# Patient Record
Sex: Male | Born: 1975 | Race: White | Hispanic: No | Marital: Married | Smoking: Never smoker
Health system: Southern US, Community
[De-identification: ages and names within clinical notes are randomized; demographics above are authoritative.]

## PROBLEM LIST (undated history)

## (undated) DIAGNOSIS — F419 Anxiety disorder, unspecified: Secondary | ICD-10-CM

## (undated) DIAGNOSIS — Q600 Renal agenesis, unilateral: Secondary | ICD-10-CM

## (undated) HISTORY — DX: Anxiety disorder, unspecified: F41.9

## (undated) HISTORY — DX: Renal agenesis, unilateral: Q60.0

## (undated) HISTORY — PX: NO PAST SURGERIES: SHX2092

---

## 2006-11-19 ENCOUNTER — Emergency Department: Payer: Self-pay | Admitting: Emergency Medicine

## 2007-04-03 ENCOUNTER — Ambulatory Visit: Payer: Self-pay | Admitting: Urology

## 2007-04-17 ENCOUNTER — Ambulatory Visit: Payer: Self-pay | Admitting: Urology

## 2007-05-01 ENCOUNTER — Ambulatory Visit: Payer: Self-pay | Admitting: Urology

## 2007-08-06 ENCOUNTER — Ambulatory Visit: Payer: Self-pay | Admitting: Urology

## 2008-02-04 ENCOUNTER — Ambulatory Visit: Payer: Self-pay | Admitting: Urology

## 2008-08-13 ENCOUNTER — Ambulatory Visit: Payer: Self-pay | Admitting: Urology

## 2009-05-18 ENCOUNTER — Ambulatory Visit: Payer: Self-pay | Admitting: Urology

## 2010-10-04 ENCOUNTER — Ambulatory Visit: Payer: Self-pay | Admitting: Urology

## 2011-04-16 ENCOUNTER — Ambulatory Visit: Payer: Self-pay | Admitting: Urology

## 2011-07-04 ENCOUNTER — Ambulatory Visit: Payer: Self-pay | Admitting: Urology

## 2011-07-19 ENCOUNTER — Ambulatory Visit: Payer: Self-pay | Admitting: Urology

## 2011-08-02 ENCOUNTER — Ambulatory Visit: Payer: Self-pay | Admitting: Urology

## 2011-12-21 ENCOUNTER — Ambulatory Visit: Payer: Self-pay | Admitting: Urology

## 2012-09-10 DIAGNOSIS — Q602 Renal agenesis, unspecified: Secondary | ICD-10-CM | POA: Insufficient documentation

## 2012-09-10 DIAGNOSIS — N200 Calculus of kidney: Secondary | ICD-10-CM | POA: Insufficient documentation

## 2012-09-24 ENCOUNTER — Ambulatory Visit: Payer: Self-pay | Admitting: Urology

## 2013-07-21 ENCOUNTER — Emergency Department: Payer: Self-pay | Admitting: Emergency Medicine

## 2013-07-21 LAB — RAPID INFLUENZA A&B ANTIGENS

## 2013-12-25 ENCOUNTER — Ambulatory Visit: Payer: Self-pay | Admitting: Urology

## 2015-04-18 ENCOUNTER — Encounter: Payer: Self-pay | Admitting: Family Medicine

## 2015-04-18 ENCOUNTER — Ambulatory Visit (INDEPENDENT_AMBULATORY_CARE_PROVIDER_SITE_OTHER): Payer: 59 | Admitting: Family Medicine

## 2015-04-18 VITALS — BP 118/82 | HR 86 | Temp 98.7°F | Resp 16 | Ht 72.0 in | Wt 199.6 lb

## 2015-04-18 DIAGNOSIS — N200 Calculus of kidney: Secondary | ICD-10-CM

## 2015-04-18 DIAGNOSIS — F419 Anxiety disorder, unspecified: Secondary | ICD-10-CM | POA: Diagnosis not present

## 2015-04-18 MED ORDER — CLONAZEPAM 0.5 MG PO TABS: ORAL_TABLET | ORAL | Status: DC

## 2015-04-18 NOTE — Patient Instructions (Signed)
Phone f/u in doing well

## 2015-04-18 NOTE — Progress Notes (Signed)
Subjective:     Patient ID: Eric Blankenship, male   DOB: 09/20/1975, 39 y.o.   MRN: [id number]  HPI  Chief Complaint  Patient presents with  . 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 Recently he stated he climbed under hom but was snuck trying to come out because crawl space was so little. Patient denies any history pf phobias, but states that this has been affecting his job.   Reports a panic attack when he got stuck in a crawl space 1.5 years ago. Since then has had intermittent "tingling" when confronted with particular crawl spaces at work. He is concerned that it is affecting his ability to do his job. Denies anxiety in other settings. Years ago was on sertraline and Xanax due to panic sx around the birth of his child. Reports he felt like a "zombie' while on sertraline.   Review of Systems     Objective:   Physical Exam  Constitutional: He appears well-developed and well-nourished. No distress.  Psychiatric: He has a normal mood and affect. His behavior is normal.       Assessment:    1. Acute anxiety: intermittent  - clonazePAM (KLONOPIN) 0.5 MG tablet; 1/2 to one twice daily as needed for anxiety  Dispense: 20 tablet; Refill: 0    Plan:    Discussed sedation with this medication with f/u in two weeks.

## 2015-04-28 ENCOUNTER — Other Ambulatory Visit: Payer: Self-pay | Admitting: Family Medicine

## 2015-04-28 ENCOUNTER — Telehealth: Payer: Self-pay | Admitting: Family Medicine

## 2015-04-28 DIAGNOSIS — F419 Anxiety disorder, unspecified: Secondary | ICD-10-CM

## 2015-04-28 MED ORDER — CLONAZEPAM 0.5 MG PO TABS: ORAL_TABLET | ORAL | Status: DC

## 2015-04-28 NOTE — Telephone Encounter (Signed)
Pt called saying the clonazepam 0.5 mg is working and he is taking one pill twice a day.  He uses [facility]. Pharmacy.    His call back is  [phone]  Thanks teri

## 2015-04-28 NOTE — Telephone Encounter (Signed)
Update on medication. KW

## 2015-04-28 NOTE — Telephone Encounter (Signed)
Patient advised.

## 2015-04-28 NOTE — Telephone Encounter (Signed)
Called into [facility] pharmacy.  

## 2015-05-06 ENCOUNTER — Ambulatory Visit: Payer: 59 | Admitting: Family Medicine

## 2015-05-22 IMAGING — CR DG ABDOMEN 1V
1 series · 1 of 1 positions shown · non-contrast
Comparison: 09/24/2012

CLINICAL DATA: Yearly followup.  History of kidney stones.

EXAM:
ABDOMEN - 1 VIEW

[t abdomen supine]
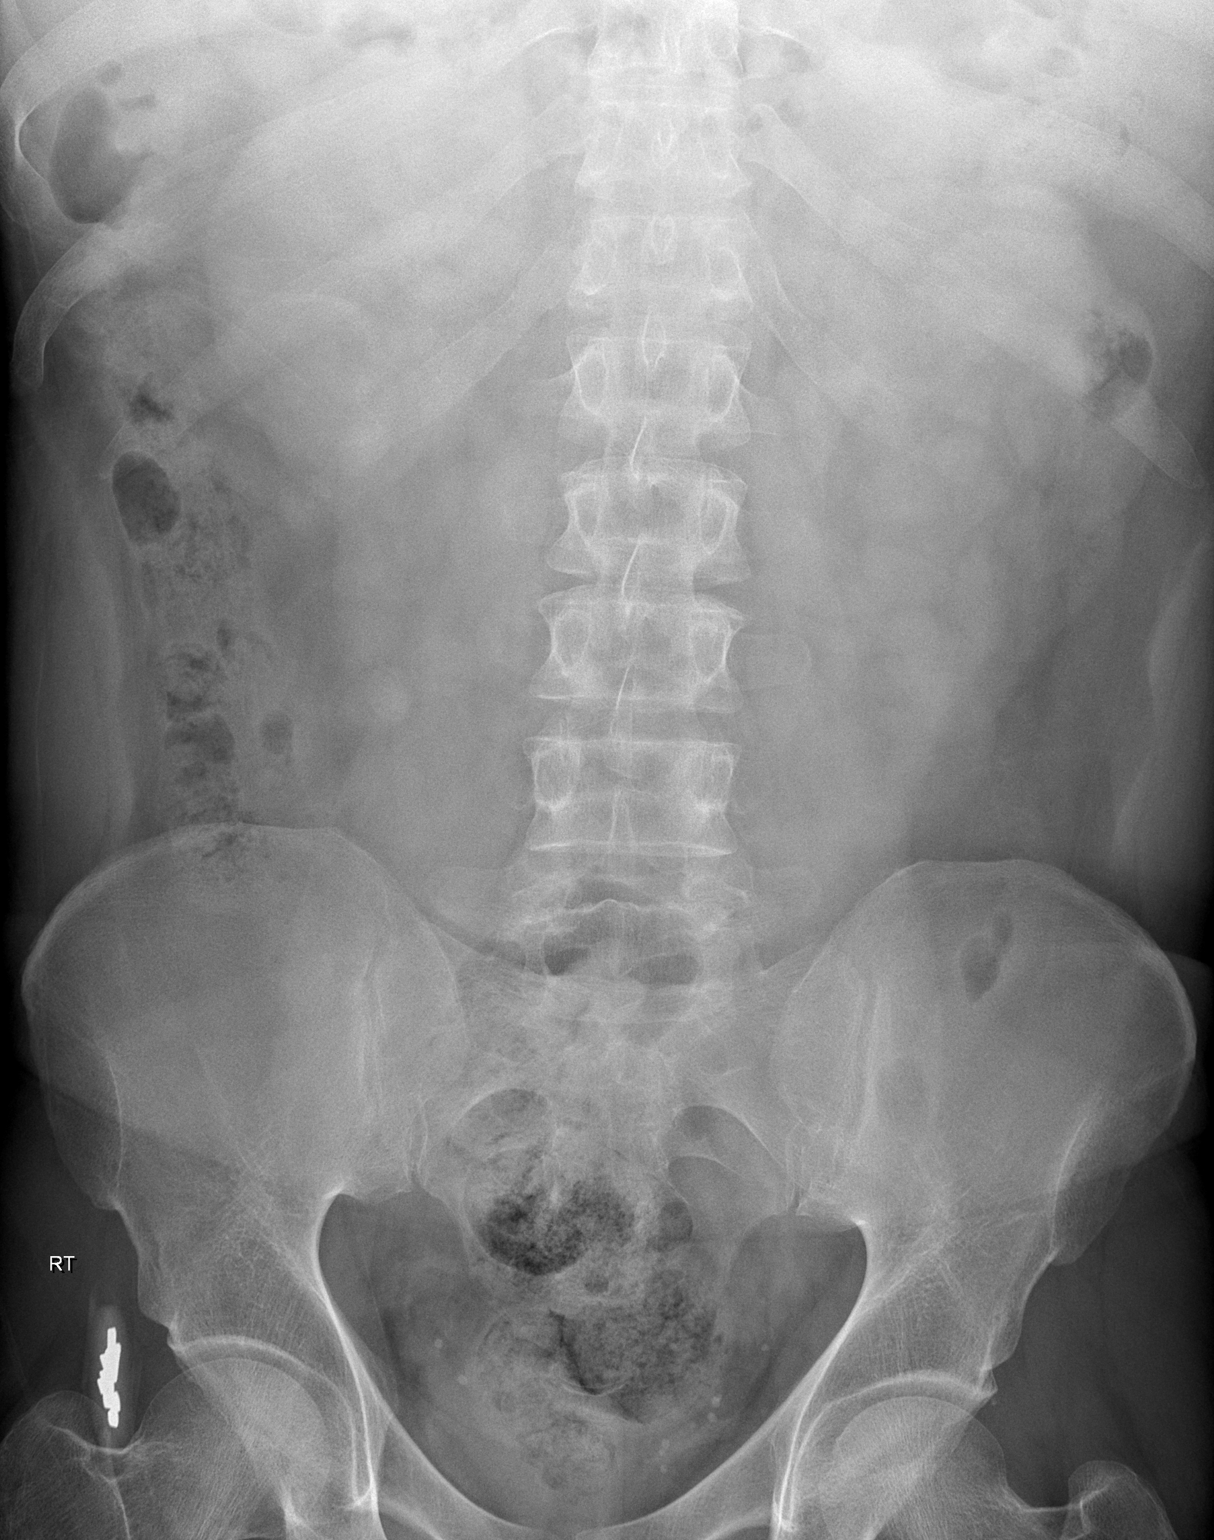

[1 of 1 positions shown; findings below may reference images not displayed]

FINDINGS: Tiny stone suggested in the lower pole of the right kidney are again
noted. There is no evidence of a new renal stone. No evidence of a
ureteral stone. There multiple stable phleboliths in the pelvis.

Bowel gas pattern is normal. Soft tissues are otherwise
unremarkable. No significant bony abnormality.
IMPRESSION: 1. No change from the prior study.
2. Subtle tiny stones suggested in the lower pole of the right
kidney.

## 2015-08-16 ENCOUNTER — Encounter: Payer: Self-pay | Admitting: Family Medicine

## 2015-08-16 ENCOUNTER — Ambulatory Visit (INDEPENDENT_AMBULATORY_CARE_PROVIDER_SITE_OTHER): Payer: 59 | Admitting: Family Medicine

## 2015-08-16 VITALS — BP 108/82 | HR 60 | Temp 97.5°F | Resp 16 | Wt 208.0 lb

## 2015-08-16 DIAGNOSIS — J039 Acute tonsillitis, unspecified: Secondary | ICD-10-CM

## 2015-08-16 DIAGNOSIS — Z87442 Personal history of urinary calculi: Secondary | ICD-10-CM | POA: Insufficient documentation

## 2015-08-16 LAB — POCT RAPID STREP A (OFFICE): RAPID STREP A SCREEN: POSITIVE — AB

## 2015-08-16 MED ORDER — AZITHROMYCIN 250 MG PO TABS: ORAL_TABLET | ORAL | Status: DC

## 2015-08-16 NOTE — Progress Notes (Signed)
Subjective:     Patient ID: Eric Blankenship, male   DOB: [birth year], 40 y.o.   MRN: [id number]  HPI  Chief Complaint  Patient presents with  . Sore Throat    Patient comes in office today with concerns of sore throat and ear pain since Friday. Patient reports that he only has discomfort on his left side and has noticed tenderness around his lymph node. Patient denies cold like symptoms but states that he does have sinus drainage. Patient has taken otc Nightquil and Tylenol for relief.  States his wife has mild cold sx and his son has strep throat 3 weeks ago. Reports hx if tonsil stones he has removed himself.   Review of Systems  Constitutional: Negative for fever and chills (no body aches).       Objective:   Physical Exam  Constitutional: He appears well-developed and well-nourished. No distress.  Ears: T.M's intact without inflammation Sinuses: non-tender Throat: mildly enlarged and erythematous tonsils with ?of right exudate or debris. Neck: no cervical adenopathy Lungs: clear     Assessment:    1. Tonsillitis - POCT rapid strep A - azithromycin (ZITHROMAX Z-PAK) 250 MG tablet; Two pills the first day then one pill daily for 4 days.  Dispense: 6 each; Refill: 0    Plan:    Discussed use of saline gargles and Mucinex D for congestion.

## 2015-08-16 NOTE — Patient Instructions (Signed)
Discussed use of warm salt water gargles and Mucinex D for congestion.

## 2015-11-01 ENCOUNTER — Ambulatory Visit (INDEPENDENT_AMBULATORY_CARE_PROVIDER_SITE_OTHER): Payer: 59 | Admitting: Family Medicine

## 2015-11-01 ENCOUNTER — Encounter: Payer: Self-pay | Admitting: Family Medicine

## 2015-11-01 VITALS — BP 110/72 | HR 66 | Temp 97.6°F | Resp 16 | Ht 72.0 in | Wt 203.2 lb

## 2015-11-01 DIAGNOSIS — E781 Pure hyperglyceridemia: Secondary | ICD-10-CM

## 2015-11-01 DIAGNOSIS — Z131 Encounter for screening for diabetes mellitus: Secondary | ICD-10-CM

## 2015-11-01 DIAGNOSIS — F411 Generalized anxiety disorder: Secondary | ICD-10-CM | POA: Diagnosis not present

## 2015-11-01 NOTE — Patient Instructions (Addendum)
Increase the clonazepam to two pills twice daily as needed. Give me a telephone follow upon how you did on the increased dose. We will call you with the lab results.

## 2015-11-01 NOTE — Progress Notes (Signed)
Subjective:     Patient ID: Eric Blankenship, male   DOB: 12-Nov-1975, 40 y.o.   MRN: [id number]  HPI  Chief Complaint  Patient presents with  . Anxiety    Patient comes in office today for follow up, last visit was on 04/18/15 patient was started on Klonopin 0.5mg  . Patient reports good compliance and tolerance on medication but states that his symptom control has been fair-poorly, patient states " I have been having more episodes latley and want to discuss increasing medication."  States he feels the medication wearing off despite taking at 8 AM and 12. 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 Also agrees to update baseline labs. Has a hx of elevated triglycerides.   Review of Systems  Psychiatric/Behavioral:       Has been on sertraline in the past but states it made him feel like a " zombie."       Objective:   Physical Exam  Constitutional: He appears well-developed and well-nourished. No distress.  Psychiatric: He has a normal mood and affect.       Assessment:    1. Generalized anxiety disorder  2. Hypertriglyceridemia - Lipid panel  3. Screening for diabetes mellitus - Comprehensive metabolic panel    Plan:    Will have him increase his clonazepam to two pills twice daily with phone follow-up

## 2015-11-02 ENCOUNTER — Telehealth: Payer: Self-pay

## 2015-11-02 ENCOUNTER — Telehealth: Payer: Self-pay | Admitting: Family Medicine

## 2015-11-02 LAB — COMPREHENSIVE METABOLIC PANEL
ALBUMIN: 4.6 g/dL (ref 3.5–5.5)
ALK PHOS: 77 IU/L (ref 39–117)
ALT: 29 IU/L (ref 0–44)
AST: 21 IU/L (ref 0–40)
Albumin/Globulin Ratio: 1.6 (ref 1.2–2.2)
BUN / CREAT RATIO: 11 (ref 9–20)
BUN: 14 mg/dL (ref 6–20)
Bilirubin Total: 0.3 mg/dL (ref 0.0–1.2)
CALCIUM: 10.1 mg/dL (ref 8.7–10.2)
CO2: 24 mmol/L (ref 18–29)
CREATININE: 1.23 mg/dL (ref 0.76–1.27)
Chloride: 99 mmol/L (ref 96–106)
GFR calc Af Amer: 86 mL/min/{1.73_m2} (ref >59)
GFR, EST NON AFRICAN AMERICAN: 74 mL/min/{1.73_m2} (ref >59)
GLOBULIN, TOTAL: 2.8 g/dL (ref 1.5–4.5)
GLUCOSE: 96 mg/dL (ref 65–99)
Potassium: 5.2 mmol/L (ref 3.5–5.2)
SODIUM: 142 mmol/L (ref 134–144)
TOTAL PROTEIN: 7.4 g/dL (ref 6.0–8.5)

## 2015-11-02 LAB — LIPID PANEL
CHOLESTEROL TOTAL: 307 mg/dL — AB (ref 100–199)
Chol/HDL Ratio: 14.6 ratio units — ABNORMAL HIGH (ref 0.0–5.0)
HDL: 21 mg/dL — ABNORMAL LOW (ref >39)
Triglycerides: 1816 mg/dL (ref 0–149)

## 2015-11-02 NOTE — Telephone Encounter (Signed)
Pt contacted office for refill request on the following medications:  clonazePAM (KLONOPIN) 0.5 MG tablet.  [facility].  ZO#[phone]/WJB#[id number]/MW  Pt wife, Isabelle CourseLydia states pt was in yesterday to see Nadine CountsBob and the requested a refill.  The pharmacy has not rec'd the refill request.

## 2015-11-02 NOTE — Telephone Encounter (Signed)
-----   Message from Anola Gurneyobert Chauvin, [place] sent at 11/02/2015  7:48 AM EDT ----- Please add Direct LDL to labs. Triglycerides are very elevated as expected. As Total cholesterol is very high suspect LDL will be too. We will get that lab value and call you again when available. Other labs including sugar look ok.

## 2015-11-02 NOTE — Telephone Encounter (Signed)
This medication was last prescribed 04/28/2015, please review. KW

## 2015-11-02 NOTE — Telephone Encounter (Signed)
Contacted [facility] to order additional lab, customer representative states that she will fax over confirmation form if they have enough left of specimen to run additional test. [facility] will contact us back if there is not a sufficient amount to run test. Renette ButtersKW

## 2015-11-02 NOTE — Telephone Encounter (Signed)
Discussed patient trying two pills twice daily with remaining supply at office visit. Phone f/u this week regarding this increased dose.

## 2015-11-02 NOTE — Telephone Encounter (Signed)
Patient has been advised of lab report as of now and additional lab ordered. Will call patient back when we receive remainder of labs, patient understood. KW

## 2015-11-02 NOTE — Telephone Encounter (Signed)
Pt called regarding his labs.  Please advise.  [phone].  Eric Blankenship

## 2015-11-03 ENCOUNTER — Other Ambulatory Visit: Payer: Self-pay | Admitting: Family Medicine

## 2015-11-03 DIAGNOSIS — E781 Pure hyperglyceridemia: Secondary | ICD-10-CM

## 2015-11-03 LAB — LDL CHOLESTEROL, DIRECT: LDL Direct: 25 mg/dL (ref 0–99)

## 2015-11-03 LAB — SPECIMEN STATUS REPORT

## 2015-11-03 MED ORDER — FENOFIBRATE 145 MG PO TABS: 145.0000 mg | ORAL_TABLET | Freq: Every day | ORAL | Status: DC

## 2015-11-03 NOTE — Telephone Encounter (Signed)
Patient had more questions regarding his triglycerides medication. He also wanted to know more about the test that was added to his specimen. Will you please call patient to explain? Thanks!

## 2015-11-03 NOTE — Telephone Encounter (Signed)
-----   Message from Anola Gurneyobert Chauvin, [place] sent at 11/03/2015  8:04 AM EDT ----- Your LDL cholesterol is ok so I will focus on lowering your triglycerides. I have sent in a once daily medication for you. Follow up in the office in 4 -6 weeks.

## 2015-11-03 NOTE — Telephone Encounter (Signed)
Discussed starting fenofibrate.

## 2015-11-04 ENCOUNTER — Other Ambulatory Visit: Payer: Self-pay | Admitting: Family Medicine

## 2015-11-04 ENCOUNTER — Telehealth: Payer: Self-pay | Admitting: Family Medicine

## 2015-11-04 DIAGNOSIS — F419 Anxiety disorder, unspecified: Secondary | ICD-10-CM

## 2015-11-04 MED ORDER — CLONAZEPAM 0.5 MG PO TABS
ORAL_TABLET | ORAL | Status: DC
Start: 2015-11-04 — End: 2016-05-22

## 2015-11-04 NOTE — Telephone Encounter (Signed)
Please review. KW 

## 2015-11-04 NOTE — Telephone Encounter (Signed)
Discussed new dose of 1 1/2 pills daily- called in.

## 2015-11-04 NOTE — Telephone Encounter (Signed)
The medication does not CAUSE kidney dysfunction; It is to be used with caution in those with kidney problems( which you don't have) as the medication is partially excreted by the kidney. If you still don't want to use it go back on Omega- 3 fish oil supplements 4 grams daily.

## 2015-11-04 NOTE — Telephone Encounter (Signed)
Pt's wife called to get an update about pt's refill for clonazePAM (KLONOPIN) 0.5 MG tablet to [facility]. There is a note in this message below to discuss the dosage with pt. Pt's wife request a call back about the refill and she is on pt's DPR. Please advise. Thanks TNP

## 2015-11-04 NOTE — Telephone Encounter (Signed)
Pt's wife picked up his medication for his triglycerides level being high and he read that  the fenofibrate (TRICOR) 145 MG tablet warnings were not to take with kidney disease.  Pt states he only has one kidney and is afraid to take this medication.  Please advise.  His call back is [phone]  Thanks, Barth Kirksteri

## 2015-11-05 NOTE — Telephone Encounter (Signed)
Spoke with patient on the phone and advised as below, patient declined starting Fenofibrate due to side effects. Patient states that his wife works in the health care field and is aware of this medication. Patient states that he does eat starches such as potatoes, bread and pasta he wondered if the sugars from these foods could cause elevation in his triglyceride level. Patient reports that he is going to continue his dietary changes and start Omega-3 fish oil supplement. Patient states when he returns for follow up labs if there is no improvement then he will reconsider taking medication. KW

## 2016-05-22 ENCOUNTER — Other Ambulatory Visit: Payer: Self-pay | Admitting: Family Medicine

## 2016-05-22 DIAGNOSIS — F419 Anxiety disorder, unspecified: Secondary | ICD-10-CM

## 2016-05-22 MED ORDER — CLONAZEPAM 0.5 MG PO TABS: ORAL_TABLET | ORAL | 5 refills | Status: DC

## 2016-05-22 NOTE — Progress Notes (Signed)
Prescription has been called into pharmacy. KW 

## 2016-06-20 DIAGNOSIS — Z6825 Body mass index (BMI) 25.0-25.9, adult: Secondary | ICD-10-CM | POA: Diagnosis not present

## 2016-06-20 DIAGNOSIS — Q602 Renal agenesis, unspecified: Secondary | ICD-10-CM | POA: Diagnosis not present

## 2016-06-20 DIAGNOSIS — N200 Calculus of kidney: Secondary | ICD-10-CM | POA: Diagnosis not present

## 2016-06-20 DIAGNOSIS — Q605 Renal hypoplasia, unspecified: Secondary | ICD-10-CM | POA: Diagnosis not present

## 2016-06-20 DIAGNOSIS — R3129 Other microscopic hematuria: Secondary | ICD-10-CM | POA: Diagnosis not present

## 2016-09-15 DIAGNOSIS — J029 Acute pharyngitis, unspecified: Secondary | ICD-10-CM | POA: Diagnosis not present

## 2016-09-24 ENCOUNTER — Encounter: Payer: Self-pay | Admitting: Family Medicine

## 2016-09-24 ENCOUNTER — Ambulatory Visit (INDEPENDENT_AMBULATORY_CARE_PROVIDER_SITE_OTHER): Payer: 59 | Admitting: Family Medicine

## 2016-09-24 VITALS — BP 108/80 | HR 99 | Temp 98.5°F | Resp 16 | Wt 203.0 lb

## 2016-09-24 DIAGNOSIS — J069 Acute upper respiratory infection, unspecified: Secondary | ICD-10-CM

## 2016-09-24 DIAGNOSIS — B9789 Other viral agents as the cause of diseases classified elsewhere: Secondary | ICD-10-CM

## 2016-09-24 MED ORDER — DOXYCYCLINE HYCLATE 100 MG PO TABS: 100.0000 mg | ORAL_TABLET | Freq: Two times a day (BID) | ORAL | 0 refills | Status: DC

## 2016-09-24 NOTE — Patient Instructions (Signed)
Continue Mucinex D and Delsym. Start the antibiotic if sinuses are not clearing over the next two days.

## 2016-09-24 NOTE — Progress Notes (Signed)
Subjective:     Patient ID: Eric Blankenship, male   DOB: 30-Nov-1975, 41 y.o.   MRN: [id number]  HPI  Chief Complaint  Patient presents with  . Nasal Congestion    Patient comes in office today with complaints of runny nose and sore throat since 09/14/16. Patient reports he was seen at [facility] on 09/22/16, in house strep was negative patient was advised to take otc day/nyquil. Patient reports that sore throat has improved but still has nasal drip and sinus pressure below the eyes.   States he developed right sided sinus pressure/tooth paiin with brown drainage yesterday. He took Mucinex D and pressure improved.   Review of Systems  Cardiovascular:       Will call for lab slip when he needs refill on fenofibrate. Also taking omega 3 fish oil daily.       Objective:   Physical Exam  Constitutional: He appears well-developed and well-nourished. No distress.  Ears: T.M's intact without inflammation Sinuses: non-tender Throat: no tonsillar enlargement or exudate Neck: no cervical adenopathy Lungs: clear     Assessment:    1. Viral upper respiratory tract infection - doxycycline (VIBRA-TABS) 100 MG tablet; Take 1 tablet (100 mg total) by mouth 2 (two) times daily.  Dispense: 20 tablet; Refill: 0    Plan:    Continue Mucinex D. If sinuses not clearing in the next two days to start abx.

## 2016-10-24 ENCOUNTER — Telehealth: Payer: Self-pay | Admitting: Family Medicine

## 2016-10-24 ENCOUNTER — Other Ambulatory Visit: Payer: Self-pay | Admitting: Family Medicine

## 2016-10-24 DIAGNOSIS — E781 Pure hyperglyceridemia: Secondary | ICD-10-CM

## 2016-10-24 NOTE — Telephone Encounter (Signed)
Nadine CountsBob, do you want to order any labs for this patient?  ED

## 2016-10-24 NOTE — Telephone Encounter (Signed)
I have sent in lab slip for lipid profile-he is to get this fasting

## 2016-10-24 NOTE — Telephone Encounter (Signed)
Pt is requesting a lab slip to recheck levels.  ZO#[phone]/WJCB#[phone]/MW

## 2016-10-24 NOTE — Telephone Encounter (Signed)
Advised  ED 

## 2016-10-30 ENCOUNTER — Other Ambulatory Visit: Payer: Self-pay | Admitting: Family Medicine

## 2016-10-30 DIAGNOSIS — E781 Pure hyperglyceridemia: Secondary | ICD-10-CM | POA: Diagnosis not present

## 2016-10-31 ENCOUNTER — Other Ambulatory Visit: Payer: Self-pay | Admitting: Family Medicine

## 2016-10-31 ENCOUNTER — Telehealth: Payer: Self-pay

## 2016-10-31 DIAGNOSIS — E781 Pure hyperglyceridemia: Secondary | ICD-10-CM

## 2016-10-31 LAB — LIPID PANEL
CHOL/HDL RATIO: 5.6 ratio — AB (ref 0.0–5.0)
Cholesterol, Total: 179 mg/dL (ref 100–199)
HDL: 32 mg/dL — AB (ref >39)
LDL Calculated: 95 mg/dL (ref 0–99)
TRIGLYCERIDES: 259 mg/dL — AB (ref 0–149)
VLDL Cholesterol Cal: 52 mg/dL — ABNORMAL HIGH (ref 5–40)

## 2016-10-31 MED ORDER — FENOFIBRATE 145 MG PO TABS: 145.0000 mg | ORAL_TABLET | Freq: Every day | ORAL | 3 refills | Status: DC

## 2016-10-31 NOTE — Telephone Encounter (Signed)
Continue fenofibrate and fish oil as you are doing. Your triglyceride levels were very high previously and will take continued medication to control.

## 2016-10-31 NOTE — Telephone Encounter (Signed)
Patient was advised he states since his triglyceride levels have dramatically decreased do you feel that he needs to continue fenofibrate since he is working on lifestyle changes? Patient says if so he will need another refill sent to [facility] outpatient pharmacy, also patient asked should he increase his fish oil? Patient states that he takes  daily.

## 2016-10-31 NOTE — Telephone Encounter (Signed)
-----   Message from Anola Gurney, [place] sent at 10/31/2016  7:36 AM EDT ----- Regarding: labs Let him know his cholesterol looks good. Does he need refills on fenofibrate?

## 2016-10-31 NOTE — Telephone Encounter (Signed)
LMTCB-KW 

## 2016-10-31 NOTE — Telephone Encounter (Signed)
Patient advised and verbally voiced understanding.  

## 2017-01-09 ENCOUNTER — Other Ambulatory Visit: Payer: Self-pay | Admitting: Family Medicine

## 2017-01-09 DIAGNOSIS — F419 Anxiety disorder, unspecified: Secondary | ICD-10-CM

## 2017-01-09 NOTE — Telephone Encounter (Signed)
Please call in refill of clonazepam as updated in the EMR.

## 2017-01-09 NOTE — Telephone Encounter (Signed)
Prescription has been called into pharmacy. KW 

## 2017-06-04 DIAGNOSIS — R3129 Other microscopic hematuria: Secondary | ICD-10-CM | POA: Diagnosis not present

## 2017-06-04 DIAGNOSIS — Q602 Renal agenesis, unspecified: Secondary | ICD-10-CM | POA: Diagnosis not present

## 2017-06-04 DIAGNOSIS — Z6825 Body mass index (BMI) 25.0-25.9, adult: Secondary | ICD-10-CM | POA: Diagnosis not present

## 2017-06-04 DIAGNOSIS — N200 Calculus of kidney: Secondary | ICD-10-CM | POA: Diagnosis not present

## 2017-06-04 DIAGNOSIS — Q605 Renal hypoplasia, unspecified: Secondary | ICD-10-CM | POA: Diagnosis not present

## 2017-06-09 DIAGNOSIS — Z23 Encounter for immunization: Secondary | ICD-10-CM | POA: Diagnosis not present

## 2017-07-10 DIAGNOSIS — H5213 Myopia, bilateral: Secondary | ICD-10-CM | POA: Diagnosis not present

## 2017-07-15 ENCOUNTER — Other Ambulatory Visit: Payer: Self-pay | Admitting: Family Medicine

## 2017-07-15 ENCOUNTER — Telehealth: Payer: Self-pay | Admitting: Family Medicine

## 2017-07-15 DIAGNOSIS — F419 Anxiety disorder, unspecified: Secondary | ICD-10-CM

## 2017-07-15 MED ORDER — CLONAZEPAM 0.5 MG PO TABS: ORAL_TABLET | ORAL | 5 refills | Status: DC

## 2017-07-15 NOTE — Telephone Encounter (Signed)
Last filled 12/31/16, last office visit for anxiety 10/2016 please review. KW

## 2017-07-15 NOTE — Telephone Encounter (Signed)
Please review. Thanks!  

## 2017-07-15 NOTE — Progress Notes (Signed)
Prescription has been called into [facility] pharmacy. KW 

## 2017-07-15 NOTE — Telephone Encounter (Signed)
Pt contacted office for refill request on the following medications:  clonazePAM (KLONOPIN) 0.5 MG tablet   [facility].  ZO#[phone]/WJCB#[phone]/MW

## 2017-07-17 ENCOUNTER — Telehealth: Payer: Self-pay | Admitting: Family Medicine

## 2017-07-17 NOTE — Telephone Encounter (Signed)
Please advise 

## 2017-07-17 NOTE — Telephone Encounter (Signed)
Pt is asking if he will need to have labs to have levels rechecked?  WU#[phone]/NFCB#[phone]/MW

## 2017-07-17 NOTE — Telephone Encounter (Signed)
appt scheduled for 11:30 07/18/17.KW

## 2017-07-17 NOTE — Telephone Encounter (Signed)
Time for an office visit-we can decide on labs then.

## 2017-07-18 ENCOUNTER — Ambulatory Visit (INDEPENDENT_AMBULATORY_CARE_PROVIDER_SITE_OTHER): Payer: 59 | Admitting: Family Medicine

## 2017-07-18 ENCOUNTER — Encounter: Payer: Self-pay | Admitting: Family Medicine

## 2017-07-18 VITALS — BP 106/84 | HR 73 | Temp 98.8°F | Resp 16 | Wt 199.8 lb

## 2017-07-18 DIAGNOSIS — Z131 Encounter for screening for diabetes mellitus: Secondary | ICD-10-CM

## 2017-07-18 DIAGNOSIS — F411 Generalized anxiety disorder: Secondary | ICD-10-CM

## 2017-07-18 DIAGNOSIS — E781 Pure hyperglyceridemia: Secondary | ICD-10-CM | POA: Diagnosis not present

## 2017-07-18 NOTE — Patient Instructions (Signed)
We will call you with the lab test results.

## 2017-07-18 NOTE — Progress Notes (Signed)
Subjective:     Patient ID: Eric Blankenship, male   DOB: [birth year], 41 y.o.   MRN: [id number] Chief Complaint  Patient presents with  . Anxiety    Follow up from 11/01/15, at last visit we increased Clonazepam to two pills twice daily. Patient denies any panic attacks and states that he has had good compliance, tolerance and symptom control.    HPI States medication will usually last him more than a month. He is able to perform his job better now. Reports a flu shot this season.  Review of Systems     Objective:   Physical Exam  Constitutional: He appears well-developed and well-nourished. No distress.  Cardiovascular: Normal rate and regular rhythm.  Pulmonary/Chest: Breath sounds normal.  Musculoskeletal: He exhibits no edema (of lower extremities).       Assessment:    1. Hypertriglyceridemia - Lipid panel  2. Generalized anxiety disorder: continue clonazepam.  3. Screening for diabetes mellitus - COMPLETE METABOLIC PANEL WITH GFR    Plan:    Further f/u pending lab results.

## 2017-08-01 ENCOUNTER — Other Ambulatory Visit: Payer: Self-pay | Admitting: Family Medicine

## 2017-08-01 DIAGNOSIS — E781 Pure hyperglyceridemia: Secondary | ICD-10-CM | POA: Diagnosis not present

## 2017-08-01 DIAGNOSIS — Z131 Encounter for screening for diabetes mellitus: Secondary | ICD-10-CM | POA: Diagnosis not present

## 2017-08-02 ENCOUNTER — Telehealth: Payer: Self-pay

## 2017-08-02 LAB — COMPREHENSIVE METABOLIC PANEL
ALT: 36 IU/L (ref 0–44)
AST: 22 IU/L (ref 0–40)
Albumin/Globulin Ratio: 2.1 (ref 1.2–2.2)
Albumin: 4.8 g/dL (ref 3.5–5.5)
Alkaline Phosphatase: 43 IU/L (ref 39–117)
BUN/Creatinine Ratio: 13 (ref 9–20)
BUN: 18 mg/dL (ref 6–20)
Bilirubin Total: 0.3 mg/dL (ref 0.0–1.2)
CALCIUM: 9.7 mg/dL (ref 8.7–10.2)
CO2: 22 mmol/L (ref 20–29)
CREATININE: 1.4 mg/dL — AB (ref 0.76–1.27)
Chloride: 103 mmol/L (ref 96–106)
GFR, EST AFRICAN AMERICAN: 73 mL/min/{1.73_m2} (ref >59)
GFR, EST NON AFRICAN AMERICAN: 63 mL/min/{1.73_m2} (ref >59)
Globulin, Total: 2.3 g/dL (ref 1.5–4.5)
Glucose: 79 mg/dL (ref 65–99)
Potassium: 5.1 mmol/L (ref 3.5–5.2)
Sodium: 142 mmol/L (ref 134–144)
TOTAL PROTEIN: 7.1 g/dL (ref 6.0–8.5)

## 2017-08-02 LAB — LIPID PANEL W/O CHOL/HDL RATIO
Cholesterol, Total: 165 mg/dL (ref 100–199)
HDL: 31 mg/dL — AB (ref >39)
LDL CALC: 100 mg/dL — AB (ref 0–99)
TRIGLYCERIDES: 171 mg/dL — AB (ref 0–149)
VLDL CHOLESTEROL CAL: 34 mg/dL (ref 5–40)

## 2017-08-02 NOTE — Telephone Encounter (Signed)
-----   Message from Anola Gurneyobert Chauvin, [place] sent at 08/02/2017  1:11 PM EST ----- Labs ok, triglycerides improved from last year. Continue current medication.

## 2017-08-02 NOTE — Telephone Encounter (Signed)
Patient advised.KW 

## 2017-09-26 ENCOUNTER — Ambulatory Visit (INDEPENDENT_AMBULATORY_CARE_PROVIDER_SITE_OTHER): Payer: 59 | Admitting: Family Medicine

## 2017-09-26 ENCOUNTER — Encounter: Payer: Self-pay | Admitting: Family Medicine

## 2017-09-26 VITALS — BP 128/82 | HR 72 | Temp 98.5°F | Ht 72.0 in | Wt 199.0 lb

## 2017-09-26 DIAGNOSIS — J069 Acute upper respiratory infection, unspecified: Secondary | ICD-10-CM | POA: Diagnosis not present

## 2017-09-26 LAB — POCT RAPID STREP A (OFFICE): Rapid Strep A Screen: NEGATIVE

## 2017-09-26 NOTE — Patient Instructions (Signed)
Add Mucinex and Delsym for cough. Continue decongestants.

## 2017-09-26 NOTE — Progress Notes (Signed)
Subjective:     Patient ID: Eric Blankenship, male   DOB: 11-19-1975, 42 y.o.   MRN: [id number] Chief Complaint  Patient presents with  . Sore Throat    Patient c/o sore throat, scratchy throat X 4 days. Difficulty swallowing and denies fever. He has taken Nyquil and Suadafed.    HPI Also reports sinus congestion, aches, and ear cough. Has been taking Sudafed for his sx. Son is sick as well.Accompanied by his wife today.  Review of Systems     Objective:   Physical Exam  Constitutional: He appears well-developed and well-nourished. No distress.  Ears: T.M's intact without inflammation Throat: mild right tonsillar enlargement with  exudate Neck: small anterior cervical nodes Lungs: clear     Assessment:    1. Viral upper respiratory tract infection - POCT rapid strep A    Plan:    Discussed over the counter medication. Will call if not improving over the next few days.

## 2017-10-16 ENCOUNTER — Other Ambulatory Visit: Payer: Self-pay | Admitting: Family Medicine

## 2017-10-16 DIAGNOSIS — E781 Pure hyperglyceridemia: Secondary | ICD-10-CM

## 2017-11-09 ENCOUNTER — Ambulatory Visit (INDEPENDENT_AMBULATORY_CARE_PROVIDER_SITE_OTHER): Payer: 59 | Admitting: Family Medicine

## 2017-11-09 ENCOUNTER — Encounter: Payer: Self-pay | Admitting: Family Medicine

## 2017-11-09 VITALS — BP 122/86 | HR 76 | Temp 98.4°F | Resp 16 | Wt 192.0 lb

## 2017-11-09 DIAGNOSIS — J358 Other chronic diseases of tonsils and adenoids: Secondary | ICD-10-CM | POA: Diagnosis not present

## 2017-11-09 DIAGNOSIS — J029 Acute pharyngitis, unspecified: Secondary | ICD-10-CM | POA: Diagnosis not present

## 2017-11-09 DIAGNOSIS — J301 Allergic rhinitis due to pollen: Secondary | ICD-10-CM | POA: Diagnosis not present

## 2017-11-09 LAB — POCT RAPID STREP A (OFFICE): Rapid Strep A Screen: NEGATIVE

## 2017-11-09 NOTE — Patient Instructions (Signed)

## 2017-11-09 NOTE — Progress Notes (Signed)
Patient: Eric Blankenship Male    DOB: 09-08-1975   42 y.o.   MRN: [id number] Visit Date: 11/09/2017  Today's Provider: Shirlee LatchAngela Linzi Ohlinger, MD   Chief Complaint  Patient presents with  . Sore Throat    Started yesterday  . Sinusitis    Started Thrusday   Subjective:    Sore Throat   This is a new problem. The current episode started yesterday. The pain is worse on the right side. There has been no fever. Associated symptoms include coughing and trouble swallowing. Pertinent negatives include no abdominal pain, congestion, diarrhea, drooling, ear discharge, ear pain, headaches, hoarse voice, plugged ear sensation, neck pain, shortness of breath, stridor, swollen glands or vomiting. He has had exposure to strep.  Sinusitis  This is a new problem. The current episode started in the past 7 days. The problem is unchanged. There has been no fever. Associated symptoms include coughing and a sore throat. Pertinent negatives include no chills, congestion, diaphoresis, ear pain, headaches, hoarse voice, neck pain, shortness of breath, sinus pressure, sneezing or swollen glands.       Allergies  Allergen Reactions  . Penicillins     Other reaction(s): Other (See Comments) Other reaction(s): Unknown  . 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  clonazePAM (KLONOPIN) 0.5 MG tablet, TAKE 1 AND 1/2 TABLET BY MOUTH ONCE DAILY AS NEEDED ANXIETY, Disp: 45 tablet, Rfl: 5 .  fenofibrate (TRICOR) 145 MG tablet, TAKE 1 TABLET BY MOUTH DAILY., Disp: 90 tablet, Rfl: 3  Review of Systems  Constitutional: Positive for fatigue. Negative for activity change, appetite change, chills, diaphoresis, fever and unexpected weight change.  HENT: Positive for postnasal drip, sore throat and trouble swallowing. Negative for congestion, drooling, ear discharge, ear pain, facial swelling, hearing loss, hoarse voice, mouth sores, nosebleeds,  rhinorrhea, sinus pressure, sinus pain and sneezing.   Respiratory: Positive for cough. Negative for apnea, choking, chest tightness, shortness of breath and stridor.   Gastrointestinal: Negative.  Negative for abdominal pain, diarrhea and vomiting.  Musculoskeletal: Negative for neck pain.  Neurological: Positive for light-headedness. Negative for dizziness and headaches.    Social History   Tobacco Use  . Smoking status: Never Smoker  . Smokeless tobacco: Current User    Types: Snuff  Substance Use Topics  . Alcohol use: Not on file   Objective:   BP 122/86 (BP Location: Right Arm, Patient Position: Sitting, Cuff Size: Normal)   Pulse 76   Temp 98.4 F (36.9 C) (Oral)   Resp 16   Wt 192 lb (87.1 kg)   BMI 26.04 kg/m  Vitals:   11/09/17 0924  BP: 122/86  Pulse: 76  Resp: 16  Temp: 98.4 F (36.9 C)  TempSrc: Oral  Weight: 192 lb (87.1 kg)     Physical Exam  Constitutional: He is oriented to person, place, and time. He appears well-developed and well-nourished. He does not appear ill. No distress.  HENT:  Head: Normocephalic and atraumatic.  Right Ear: Tympanic membrane and ear canal normal.  Left Ear: Tympanic membrane and ear canal normal.  Mouth/Throat: Mucous membranes are normal. No uvula swelling. Oropharyngeal exudate and posterior oropharyngeal erythema present. No posterior oropharyngeal edema. Tonsillar exudate.  + tonsil stone of R tonsil  Eyes: Pupils are equal, round, and reactive to light. EOM are normal.  Neck: Neck supple. No thyromegaly present.  Cardiovascular: Normal rate, regular  rhythm, normal heart sounds and intact distal pulses.  No murmur heard. Pulmonary/Chest: Effort normal and breath sounds normal. No respiratory distress. He has no wheezes. He has no rales.  Lymphadenopathy:    He has no cervical adenopathy.  Neurological: He is alert and oriented to person, place, and time.  Skin: Skin is warm and dry. Capillary refill takes less than  2 seconds. No rash noted.  Psychiatric: He has a normal mood and affect. His behavior is normal.    Results for orders placed or performed in visit on 11/09/17  POCT rapid strep A  Result Value Ref Range   Rapid Strep A Screen Negative Negative       Assessment & Plan:  1. Sore throat -Rapid strep test negative, but patient does have oropharyngeal erythema and exudate as well as exposure to strep throat recently -Send culture to confirm -Pending culture results, treat as indicated - POCT rapid strep A - Culture, Group A Strep  2. Seasonal allergic rhinitis due to pollen -Sinus congestion seems related to allergic rhinitis -Advised continuing Zyrtec regularly and adding Nasonex or Flonase -Can use Sudafed sparingly  3. Tonsil stone -Patient reports recurrent tonsil stones and he currently has a small 1 -Discussed swishing with mouthwash daily to clean the crypts of his tonsils - Patient wishes to have tonsillectomy, but discussed that this is not indicated at this time -He will call if he wishes to have a referral to ENT to discuss further   Return if symptoms worsen or fail to improve.   The entirety of the information documented in the History of Present Illness, Review of Systems and Physical Exam were personally obtained by me. Portions of this information were initially documented by Kavin Leech, CMA and reviewed by me for thoroughness and accuracy.    Erasmo Downer, MD, MPH [facility] 11/09/2017 10:17 AM

## 2017-11-11 ENCOUNTER — Telehealth: Payer: Self-pay | Admitting: Family Medicine

## 2017-11-11 DIAGNOSIS — J029 Acute pharyngitis, unspecified: Secondary | ICD-10-CM | POA: Diagnosis not present

## 2017-11-11 MED ORDER — DOXYCYCLINE HYCLATE 100 MG PO TABS: 100.0000 mg | ORAL_TABLET | Freq: Two times a day (BID) | ORAL | 0 refills | Status: DC

## 2017-11-11 NOTE — Telephone Encounter (Signed)
Rx sent for doxycycline  Beryle FlockBacigalupo, Marzella SchleinAngela M, MD, MPH [facility] 11/11/2017 2:31 PM

## 2017-11-11 NOTE — Telephone Encounter (Signed)
Left message advising pt. 

## 2017-11-11 NOTE — Telephone Encounter (Signed)
Please review

## 2017-11-11 NOTE — Telephone Encounter (Signed)
Pt's wife Eric Blankenship called to see if Rx for Doxycyline had been sent in. Eric Blankenship was advised that messages can take 24 to 48 hours. Eric Blankenship [facility] [address] [facility] b/c she is concerned that if it's sent in today they might not be able to make it before it closes. Please advise. Thanks TNP

## 2017-11-11 NOTE — Telephone Encounter (Signed)
Patient states that he was in to see you on 11/09/2017 and you saw him and his wife.  He states that he has gotten progressively worse over the last 24 hours.  He is coughing up and blowing out dark brownish mucus.  He states that he almost lost his voice.  He states that you gave his wife a antibiotic and she has noticed a big difference already.  He states that he has all the same symptoms that she had and would like to know if you could send in the same thing that you gave her.  [facility].

## 2017-11-13 LAB — CULTURE, GROUP A STREP: Strep A Culture: NEGATIVE

## 2017-11-13 LAB — SPECIMEN STATUS REPORT

## 2017-11-14 ENCOUNTER — Telehealth: Payer: Self-pay

## 2017-11-14 NOTE — Telephone Encounter (Signed)
Pt advised.

## 2017-11-14 NOTE — Telephone Encounter (Signed)
-----   Message from Erasmo DownerAngela M Bacigalupo, MD sent at 11/14/2017  8:17 AM EDT ----- Strep culture negative  Bacigalupo, Marzella SchleinAngela M, MD, MPH [facility] 11/14/2017 8:17 AM

## 2018-01-27 ENCOUNTER — Other Ambulatory Visit: Payer: Self-pay | Admitting: Family Medicine

## 2018-01-27 DIAGNOSIS — F419 Anxiety disorder, unspecified: Secondary | ICD-10-CM

## 2018-07-01 ENCOUNTER — Ambulatory Visit: Payer: 59 | Admitting: Family Medicine

## 2018-07-01 ENCOUNTER — Encounter: Payer: Self-pay | Admitting: Family Medicine

## 2018-07-01 ENCOUNTER — Other Ambulatory Visit: Payer: Self-pay

## 2018-07-01 VITALS — BP 120/84 | HR 98 | Temp 98.2°F | Ht 72.0 in | Wt 192.2 lb

## 2018-07-01 DIAGNOSIS — J069 Acute upper respiratory infection, unspecified: Secondary | ICD-10-CM | POA: Diagnosis not present

## 2018-07-01 NOTE — Progress Notes (Signed)
  Subjective:     Patient ID: Eric Blankenship, male   DOB: January 19, 1976, 42 y.o.   MRN: [id number] Chief Complaint  Patient presents with  . sinus congestion    some yellowish green mucus coming up.  sx's started 06/19/18   . sinus pressure    can feel in ears and has gone into chest.  some SOB    HPI Reports clear sinus congestion now and minimally productive cough. No fever or chills.  Review of Systems     Objective:   Physical Exam  Constitutional: He appears well-developed and well-nourished. No distress.  Ears: T.M's intact without inflammation Sinuses: non-tender Throat: no tonsillar enlargement or exudate Neck: no cervical adenopathy Lungs: clear     Assessment:    1. URI, acute    Plan:    Discussed continuing to treat symptomatically with Mucinex D, Nyquil and Delsym Call if not improving over the next few days.

## 2018-07-01 NOTE — Patient Instructions (Signed)
Try Mucinex D during the day and Nyquil at night. Add Delsym for cough. Call me if not improving by the end of the week or your start to see colored sinus drainage.

## 2018-07-02 ENCOUNTER — Ambulatory Visit
Admission: RE | Admit: 2018-07-02 | Discharge: 2018-07-02 | Disposition: A | Discharge status: Home / Self Care | Payer: 59 | Source: Ambulatory Visit | Attending: Urology | Admitting: Urology

## 2018-07-02 ENCOUNTER — Encounter: Payer: Self-pay | Admitting: Urology

## 2018-07-02 ENCOUNTER — Ambulatory Visit (INDEPENDENT_AMBULATORY_CARE_PROVIDER_SITE_OTHER): Payer: 59 | Admitting: Urology

## 2018-07-02 VITALS — BP 120/82 | HR 92 | Ht 72.0 in | Wt 191.8 lb

## 2018-07-02 DIAGNOSIS — Z87442 Personal history of urinary calculi: Secondary | ICD-10-CM

## 2018-07-02 LAB — URINALYSIS, COMPLETE
Bilirubin, UA: NEGATIVE
GLUCOSE, UA: NEGATIVE
KETONES UA: NEGATIVE
Leukocytes, UA: NEGATIVE
NITRITE UA: NEGATIVE
PROTEIN UA: NEGATIVE
RBC, UA: NEGATIVE
SPEC GRAV UA: 1.025 (ref 1.005–1.030)
UUROB: 0.2 mg/dL (ref 0.2–1.0)
pH, UA: 5.5 (ref 5.0–7.5)

## 2018-07-02 LAB — MICROSCOPIC EXAMINATION
Epithelial Cells (non renal): NONE SEEN /hpf (ref 0–10)
RBC MICROSCOPIC, UA: NONE SEEN /HPF (ref 0–2)
WBC UA: NONE SEEN /HPF (ref 0–5)

## 2018-07-02 NOTE — Addendum Note (Signed)
Addended by: Martha ClanWATTS, [place] Folden M on: 07/02/2018 09:04 AM   Modules accepted: Orders

## 2018-07-02 NOTE — Progress Notes (Signed)
07/02/2018 8:40 AM   Eric Blankenship 1975-08-05 [id number]  Referring provider: Anola Gurneyhauvin, [place], [place] [address] [place], [place] [id number]  Chief Complaint  Patient presents with  . Nephrolithiasis    HPI: 42 year old male presents to establish local urologic care.  He has a history of recurrent stone disease and has previously seen Dr. Achilles Dunkope.  [facility].  He has an atrophic left kidney.  Prior treatments include shockwave lithotripsy and September 2008.  Last imaging was a KUB performed in November 2018 which showed no definite urinary tract calculi.  Since his last visit he states he has been doing well.  Denies flank/abdominal/pelvic or scrotal pain.  He denies bothersome lower urinary tract symptoms or gross hematuria.  PMH: Past Medical History:  Diagnosis Date  . Anxiety   . Congenital absence of one kidney     Surgical History: Past Surgical History:  Procedure Laterality Date  . NO PAST SURGERIES      Home Medications:  Allergies as of 07/02/2018      Reactions   Penicillins    Other reaction(s): Other (See Comments) Other reaction(s): Unknown   Prednisone    Other reaction(s): Kidney Disorder Inability to pass urine      Medication List        Accurate as of 07/02/18  8:40 AM. Always use your most recent med list.          ALLERGY MED PO Take by mouth.   clonazePAM 0.5 MG tablet Commonly known as:  KLONOPIN TAKE 1 & 1/2 TABLET BY MOUTH ONCE DAILY AS NEEDED ANXIETY   fenofibrate 145 MG tablet Commonly known as:  TRICOR TAKE 1 TABLET BY MOUTH DAILY.   Fish Oil 1000 MG Cpdr Take by mouth.       Allergies:  Allergies  Allergen Reactions  . Penicillins     Other reaction(s): Other (See Comments) Other reaction(s): Unknown  . Prednisone     Other reaction(s): Kidney Disorder Inability to pass urine    Family History: No family history on file.  Social History:  reports that he has never  smoked. His smokeless tobacco use includes snuff. He reports that he drank alcohol. He reports that he has current or past drug history.  ROS: UROLOGY Frequent Urination?: No Hard to postpone urination?: No Burning/pain with urination?: No Get up at night to urinate?: No Leakage of urine?: No Urine stream starts and stops?: No Trouble starting stream?: No Do you have to strain to urinate?: No Blood in urine?: No Urinary tract infection?: No Sexually transmitted disease?: No Injury to kidneys or bladder?: No Painful intercourse?: No Weak stream?: No Erection problems?: No Penile pain?: No  Gastrointestinal Nausea?: No Vomiting?: No Indigestion/heartburn?: No Diarrhea?: No Constipation?: No  Constitutional Fever: No Night sweats?: No Weight loss?: No Fatigue?: No  Skin Skin rash/lesions?: No Itching?: No  Eyes Blurred vision?: No Double vision?: No  Ears/Nose/Throat Sore throat?: No Sinus problems?: No  Hematologic/Lymphatic Swollen glands?: No Easy bruising?: No  Cardiovascular Leg swelling?: No Chest pain?: No  Respiratory Cough?: No Shortness of breath?: No  Endocrine Excessive thirst?: No  Musculoskeletal Back pain?: No Joint pain?: No  Neurological Headaches?: No Dizziness?: No  Psychologic Depression?: No Anxiety?: Yes  Physical Exam: BP 120/82 (BP Location: Left Arm, Patient Position: Sitting, Cuff Size: Normal)   Pulse 92   Ht 6' (1.829 m)   Wt 191 lb 12.8 oz (87 kg)  BMI 26.01 kg/m   Constitutional:  Alert and oriented, No acute distress. HEENT: [name], moist mucus membranes.  Trachea midline, no masses. Cardiovascular: No clubbing, cyanosis, or edema. Respiratory: Normal respiratory effort, no increased work of breathing. Skin: No rashes, bruises or suspicious lesions. Neurologic: Grossly intact, no focal deficits, moving all 4 extremities. Psychiatric: Normal mood and affect.   Urinalysis Dipstick/microscopy  negative  Assessment & Plan:   42 year old male with a solitary functioning kidney and history of recurrent nephrolithiasis.  Urinalysis today is unremarkable and he is asymptomatic.  A KUB was ordered and he will be notified of the results.  If no significant abnormalities on x-ray would recommend he continue annual follow-up/KUB.   Riki Altes, MD  [facility] [address] [place], [place] [address] [phone]

## 2018-07-03 ENCOUNTER — Telehealth: Payer: Self-pay

## 2018-07-03 NOTE — Telephone Encounter (Signed)
-----   Message from Riki AltesScott C Stoioff, MD sent at 07/03/2018  7:43 AM EST ----- KUB reviewed and no calcifications suspicious for urinary stones

## 2018-07-03 NOTE — Telephone Encounter (Signed)
Patient notified

## 2018-07-17 ENCOUNTER — Telehealth: Payer: Self-pay | Admitting: Family Medicine

## 2018-07-17 ENCOUNTER — Other Ambulatory Visit: Payer: Self-pay | Admitting: Family Medicine

## 2018-07-17 DIAGNOSIS — J0100 Acute maxillary sinusitis, unspecified: Secondary | ICD-10-CM

## 2018-07-17 MED ORDER — DOXYCYCLINE HYCLATE 100 MG PO TABS: 100.0000 mg | ORAL_TABLET | Freq: Two times a day (BID) | ORAL | 0 refills | Status: DC

## 2018-07-17 NOTE — Telephone Encounter (Signed)
Patient reports increased sinus pressure, purulent sinus drainage, post nasal drainage and accompanying cough. Will send in doxycycline

## 2018-07-17 NOTE — Telephone Encounter (Signed)
Pt has been taking medication at prescribed by Nadine CountsBob.  He is now having severe yellowish dark brown mucus coming from his sinus and his teeth are hurting.    Please advise.  Thanks, [employer]

## 2018-07-17 NOTE — Telephone Encounter (Signed)
Pease review.  dbs

## 2018-08-25 ENCOUNTER — Other Ambulatory Visit: Payer: Self-pay | Admitting: Family Medicine

## 2018-08-25 DIAGNOSIS — F419 Anxiety disorder, unspecified: Secondary | ICD-10-CM

## 2018-09-22 ENCOUNTER — Other Ambulatory Visit: Payer: Self-pay | Admitting: Family Medicine

## 2018-09-22 DIAGNOSIS — E781 Pure hyperglyceridemia: Secondary | ICD-10-CM

## 2019-03-03 ENCOUNTER — Other Ambulatory Visit: Payer: Self-pay | Admitting: Family Medicine

## 2019-03-03 DIAGNOSIS — F419 Anxiety disorder, unspecified: Secondary | ICD-10-CM

## 2019-03-03 DIAGNOSIS — E781 Pure hyperglyceridemia: Secondary | ICD-10-CM

## 2019-03-03 NOTE — Telephone Encounter (Addendum)
[facility] faxed refill request for the following medications:  clonazePAM (KLONOPIN) 0.5 MG tablet  fenofibrate (TRICOR) 145 MG tablet     Please advise.

## 2019-03-05 MED ORDER — FENOFIBRATE 145 MG PO TABS: 145.0000 mg | ORAL_TABLET | Freq: Every day | ORAL | 1 refills | Status: AC

## 2019-03-05 MED ORDER — CLONAZEPAM 0.5 MG PO TABS: 0.2500 mg | ORAL_TABLET | Freq: Two times a day (BID) | ORAL | 0 refills | Status: DC | PRN

## 2019-03-05 NOTE — Telephone Encounter (Signed)
One month supply will be given, but patient needs f/u appt and to establish with new provider prior to further refills being given.

## 2019-03-09 NOTE — Telephone Encounter (Signed)
LMTCB to schedule a appointment with a new provider.

## 2019-03-11 ENCOUNTER — Ambulatory Visit: Payer: 59 | Admitting: Physician Assistant

## 2019-03-18 ENCOUNTER — Encounter: Payer: Self-pay | Admitting: Physician Assistant

## 2019-03-18 ENCOUNTER — Other Ambulatory Visit: Payer: Self-pay

## 2019-03-18 ENCOUNTER — Ambulatory Visit: Payer: Managed Care, Other (non HMO) | Admitting: Physician Assistant

## 2019-03-18 VITALS — BP 120/84 | HR 83 | Temp 98.5°F | Resp 16 | Wt 200.0 lb

## 2019-03-18 DIAGNOSIS — F419 Anxiety disorder, unspecified: Secondary | ICD-10-CM | POA: Diagnosis not present

## 2019-03-18 MED ORDER — BUSPIRONE HCL 7.5 MG PO TABS: 7.5000 mg | ORAL_TABLET | Freq: Two times a day (BID) | ORAL | 2 refills | Status: AC

## 2019-03-18 MED ORDER — HYDROXYZINE HCL 10 MG PO TABS: 10.0000 mg | ORAL_TABLET | Freq: Two times a day (BID) | ORAL | 0 refills | Status: DC | PRN

## 2019-03-18 NOTE — Progress Notes (Signed)
Patient: Eric RasDavid S Doughten Male    DOB: 14-Sep-1975   43 y.o.   MRN: [id number] Visit Date: 03/18/2019  Today's Provider: Trey SailorsAdriana M Jhana Giarratano, PA-C   Chief Complaint  Patient presents with  . Establish Care   Subjective:     HPI   Patient previously saw Toni ArthursBob Chauvin, PA-C who has since retired.   [place], [place] works in [employer]. He is following up today for anxiety.   Patient has been treated for anxiety at this office for the past 4 years. Patient has a history of depression since his teenage years according to note from 02/18/2007. He is currently taking klonopin 0.5 mg twice daily, which is increased one half pill from two weeks prior. He was started on this in 2016. He reports he was started on this because he got stuck when trying to come out of a crawl space and this has created anxiety. 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 He additionally had a PRN script for anxiety around the birth of his child. He reports he has panic attack symptoms to include impending doom, chest pain, SOB. He will experience these symptoms during his working hours. He will take the klonopin M-F when he is working. He does not wait until he experiences these symptoms. He will take a klonopin regularly during working hours. He reports he does not take it on the weekends and has gone 8 days on a beach trip without taking it. He has gone to therapy previously but never for exposure therapy. He reports he will absolutely not go to therapy because his experiences in life have taught him that he must tough everything out. Last addressed this issue 07/18/2017 and no showed last office visit on 03/11/2019.  Allergies  Allergen Reactions  . Penicillins     Other reaction(s): Other (See Comments) Other reaction(s): Unknown  . 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   clonazePAM (KLONOPIN) 0.5 MG tablet, Take 0.5 tablets (0.25 mg total) by mouth 2 (two) times daily as needed for anxiety., Disp: 30 tablet, Rfl: 0 .  diphenhydrAMINE HCl (ALLERGY MED PO), Take by mouth., Disp: , Rfl:  .  fenofibrate (TRICOR) 145 MG tablet, Take 1 tablet (145 mg total) by mouth daily., Disp: 30 tablet, Rfl: 1 .  Omega-3 Fatty Acids (FISH OIL) 1000 MG CPDR, Take by mouth., Disp: , Rfl:  .  doxycycline (VIBRA-TABS) 100 MG tablet, Take 1 tablet (100 mg total) by mouth 2 (two) times daily. (Patient not taking: Reported on 03/18/2019), Disp: 20 tablet, Rfl: 0  Review of Systems  Constitutional: Negative for appetite change, chills and fever.  Respiratory: Negative for chest tightness, shortness of breath and wheezing.   Cardiovascular: Negative for chest pain and palpitations.  Gastrointestinal: Negative for abdominal pain, nausea and vomiting.    Social History   Tobacco Use  . Smoking status: Never Smoker  . Smokeless tobacco: Current User    Types: Snuff  Substance Use Topics  . Alcohol use: Not Currently    Alcohol/week: 0.0 standard drinks      Objective:   BP 120/84 (BP Location: Right Arm, Patient Position: Sitting, Cuff Size: Large)   Pulse 83   Temp 98.5 F (36.9 C) (Temporal)   Resp 16   Wt 200 lb (90.7 kg)  SpO2 97%   BMI 27.12 kg/m  Vitals:   03/18/19 1608  BP: 120/84  Pulse: 83  Resp: 16  Temp: 98.5 F (36.9 C)  TempSrc: Temporal  SpO2: 97%  Weight: 200 lb (90.7 kg)     Physical Exam Constitutional:      Appearance: Normal appearance. He is not ill-appearing.  Cardiovascular:     Rate and Rhythm: Normal rate.  Pulmonary:     Effort: Pulmonary effort is normal. No respiratory distress.  Skin:    General: Skin is warm and dry.  Neurological:     Mental Status: He is alert and oriented to person, place, and time. Mental status is at baseline.  Psychiatric:        Mood and Affect: Affect is angry.      No results found for any  visits on 03/18/19.     Assessment & Plan    1. Anxiety  Had a very long, in depth conversation with this patient. I have explained that I do not prescribe benzodiazepines as monotherapy for anxiety for reasons including risk of dependence, addiction, oversedation, interactions with other medication. I have counseled that this medication is meant for PRN use in finite circumstances and to need this medication to perform your full time job is indicative of a larger issue with anxiety. I have recommended exposure therapy counseling to help patient overcome his fear of small spaces, which patient has adamantly refused multiple times throughout the office visit. He states this would not help him and he has no interest in going.   I have counseled patient that I would recommend a daily maintenance medication like Buspar and PRN medication like hydroxyzine. Patient becomes very angry when he learns I will not continue his Klonopin prescription. 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 He states this is helping his chemical imbalance. I have repeated my reasoning. I have explained to him that klonopin does nothing to improve a chemical imbalance like an SSRI/SNRI would but rather works to depress his central nervous system temporarily. He states that he understand this applies generally to people but that cases need to be considered individually. He expresses anger that I will not meet him in the middle, which he defines as continuing the klonopin description. He states I am giving him no options and I am telling him it's "my way or the high way."  He reports that if I will not refill his klonopin, then he will have to find another provider. Then, if he does not agree with what they say, he will come back here. I have been explicit with him that he is free to find another provider, but that if he does so I will not  accept him back as a patient because the initial reason he left will still remain and I do not see that patient-provider relationship being valuable.   The options moving forward as I see it are thus:  1. He may find a new provider of his choosing, though he will need to look outside this clinic as providers accepting patients at this clinic will also decline to fill this prescription. If he does so, I will continue his prescription for three months at a taper of 0.25 mg klonopin per month. I will not accept him back as a patient. 2. He may try alternative medication regimes. I have explained these in  depth and answered all questions regarding the timing of dosing and how they work. He may also attend counseling, which he continues to refuse. I will taper his dose of klonopin by 0.25 mg per month over a total of four months.   If he does not find a new provider and wishes to continue receiving klonopin at a taper, than he will need to be seen at least once every three months. Once the klonopin is discontinued, visits may not be as frequent. He expresses understanding and wishes to try a new medication. He has signed a controlled substance agreement today.   - busPIRone (BUSPAR) 7.5 MG tablet; Take 1 tablet (7.5 mg total) by mouth 2 (two) times daily.  Dispense: 60 tablet; Refill: 2 - hydrOXYzine (ATARAX/VISTARIL) 10 MG tablet; Take 1 tablet (10 mg total) by mouth 2 (two) times daily as needed.  Dispense: 30 tablet; Refill: 0  I have spent 60 minutes with this patient, >50% of which was spent on counseling and coordination of care.     Trinna Post, PA-C  [facility]

## 2019-03-18 NOTE — Patient Instructions (Signed)
Starting buspar 7.5 mg twice daily - every day Hydroxyzine as needed 10 mg twice a day, may increase to 20 mg twice daily Klonopin new dose: 0.5 mg in the morning and 0.25 mg nightly.

## 2019-04-17 ENCOUNTER — Ambulatory Visit: Payer: Self-pay | Admitting: Physician Assistant

## 2019-06-11 ENCOUNTER — Other Ambulatory Visit: Payer: Self-pay | Admitting: Orthopedic Surgery

## 2019-06-11 DIAGNOSIS — M5416 Radiculopathy, lumbar region: Secondary | ICD-10-CM

## 2019-06-11 DIAGNOSIS — M5442 Lumbago with sciatica, left side: Secondary | ICD-10-CM

## 2019-06-29 ENCOUNTER — Other Ambulatory Visit: Payer: Self-pay | Admitting: Orthopedic Surgery

## 2019-06-29 DIAGNOSIS — M5416 Radiculopathy, lumbar region: Secondary | ICD-10-CM

## 2019-06-29 DIAGNOSIS — M5442 Lumbago with sciatica, left side: Secondary | ICD-10-CM

## 2019-07-01 ENCOUNTER — Ambulatory Visit (INDEPENDENT_AMBULATORY_CARE_PROVIDER_SITE_OTHER): Payer: Managed Care, Other (non HMO) | Admitting: Urology

## 2019-07-01 ENCOUNTER — Ambulatory Visit
Admission: RE | Admit: 2019-07-01 | Discharge: 2019-07-01 | Disposition: A | Discharge status: Home / Self Care | Payer: 59 | Source: Ambulatory Visit | Attending: Urology | Admitting: Urology

## 2019-07-01 ENCOUNTER — Other Ambulatory Visit: Payer: Self-pay

## 2019-07-01 ENCOUNTER — Other Ambulatory Visit: Payer: Self-pay | Admitting: Family Medicine

## 2019-07-01 ENCOUNTER — Ambulatory Visit: Payer: 59 | Admitting: Urology

## 2019-07-01 ENCOUNTER — Encounter: Payer: Self-pay | Admitting: Urology

## 2019-07-01 VITALS — BP 130/91 | HR 98 | Ht 72.0 in | Wt 200.0 lb

## 2019-07-01 DIAGNOSIS — Z87442 Personal history of urinary calculi: Secondary | ICD-10-CM

## 2019-07-01 DIAGNOSIS — N261 Atrophy of kidney (terminal): Secondary | ICD-10-CM

## 2019-07-01 NOTE — Progress Notes (Signed)
07/01/2019 9:59 AM   Eric Blankenship 1976-02-03 [id number]  Referring provider: Anola Gurney, PA No address on file  Chief Complaint  Patient presents with  . Follow-up    HPI: Eric Blankenship is a 43 year old male with extreme left kidney atrophy and a history of nephrolithiasis who presents today for a yearly follow up.   He has a personal history of kidney stones requiring surgical intervention in the form of ESWL dating back to 2008.  Over the past year, he believes he may have passed 1 or 2 very very small stones without any significant difficulty.  He tries to drink plenty of water and lemonade.  KUB today shows no significant stone burden.  He denies any flank pain.  PMH: Past Medical History:  Diagnosis Date  . Anxiety   . Congenital absence of one kidney     Surgical History: Past Surgical History:  Procedure Laterality Date  . NO PAST SURGERIES      Home Medications:  Allergies as of 07/01/2019      Reactions   Penicillins    Other reaction(s): Other (See Comments) Other reaction(s): Unknown   Prednisone    Other reaction(s): Kidney Disorder Inability to pass urine      Medication List       Accurate as of July 01, 2019  9:59 AM. If you have any questions, ask your nurse or doctor.        ALLERGY MED PO Take by mouth.   fenofibrate 145 MG tablet Commonly known as: TRICOR Take 1 tablet (145 mg total) by mouth daily.   Fish Oil 1000 MG Cpdr Take by mouth.   gabapentin 100 MG capsule Commonly known as: NEURONTIN Take 100 mg by mouth as needed.   hydrOXYzine 10 MG tablet Commonly known as: ATARAX/VISTARIL Take 1 tablet (10 mg total) by mouth 2 (two) times daily as needed.       Allergies:  Allergies  Allergen Reactions  . Penicillins     Other reaction(s): Other (See Comments) Other reaction(s): Unknown  . Prednisone     Other reaction(s): Kidney Disorder Inability to pass urine    Family History: No family history on  file.  Social History:  reports that he has never smoked. His smokeless tobacco use includes snuff. He reports previous alcohol use. He reports previous drug use.  ROS: UROLOGY Frequent Urination?: No Hard to postpone urination?: No Burning/pain with urination?: No Get up at night to urinate?: No Leakage of urine?: No Urine stream starts and stops?: No Trouble starting stream?: No Do you have to strain to urinate?: No Blood in urine?: No Urinary tract infection?: No Sexually transmitted disease?: No Injury to kidneys or bladder?: No Painful intercourse?: No Weak stream?: No Erection problems?: No Penile pain?: No  Gastrointestinal Nausea?: No Vomiting?: No Indigestion/heartburn?: No Diarrhea?: No Constipation?: No  Constitutional Fever: No Night sweats?: No Weight loss?: No Fatigue?: No  Skin Skin rash/lesions?: No Itching?: No  Eyes Blurred vision?: No Double vision?: No  Ears/Nose/Throat Sore throat?: No Sinus problems?: No  Hematologic/Lymphatic Swollen glands?: No Easy bruising?: No  Cardiovascular Leg swelling?: No Chest pain?: No  Respiratory Cough?: No Shortness of breath?: No  Endocrine Excessive thirst?: No  Musculoskeletal Back pain?: No Joint pain?: No  Neurological Headaches?: No Dizziness?: No  Psychologic Depression?: No Anxiety?: No  Physical Exam: BP (!) 130/91   Pulse 98   Ht 6' (1.829 m)   Wt 200 lb (90.7 kg)  BMI 27.12 kg/m   Constitutional:  Well nourished. Alert and oriented, No acute distress. HEENT: [place] AT, moist mucus membranes.  Trachea midline, no masses. Cardiovascular: No clubbing, cyanosis, or edema. Respiratory: Normal respiratory effort, no increased work of breathing. Skin: No rashes, bruises or suspicious lesions. Neurologic: Grossly intact, no focal deficits, moving all 4 extremities. Psychiatric: Normal mood and affect.  Laboratory Data: Lab Results  Component Value Date   CREATININE 1.40  (H) 08/01/2017       Component Value Date/Time   CHOL 165 08/01/2017 0834   HDL 31 (L) 08/01/2017 0834   CHOLHDL 5.6 (H) 10/30/2016 0809   LDLCALC 100 (H) 08/01/2017 0834    Lab Results  Component Value Date   AST 22 08/01/2017   Lab Results  Component Value Date   ALT 36 08/01/2017    Urinalysis    Component Value Date/Time   APPEARANCEUR Clear 07/02/2018 0838   GLUCOSEU Negative 07/02/2018 0838   BILIRUBINUR Negative 07/02/2018 0838   PROTEINUR Negative 07/02/2018 0838   NITRITE Negative 07/02/2018 0838   LEUKOCYTESUR Negative 07/02/2018 0838    Pertinent Imaging: KUB today was personally reviewed.  There is no obvious stone burden.  Is compared to KUB last year which is also unremarkable.  Right renal shadow appreciated left is absent.  I have independently reviewed the films.    Assessment & Plan:    1. Severely atrophic left kidney Presumably congenital  2. History of nephrolithiasis No significant stone episodes or any radiographic evidence of stone disease on serial KUB  Given lack of significant stone history in the recent past, would recommend following up clinically as needed at this point in time  Follow-up as needed  Hollice Espy, [place], [place] [address] [phone]

## 2019-07-02 ENCOUNTER — Other Ambulatory Visit: Payer: 59

## 2019-07-08 ENCOUNTER — Other Ambulatory Visit: Payer: Self-pay

## 2019-07-08 ENCOUNTER — Ambulatory Visit
Admission: RE | Admit: 2019-07-08 | Discharge: 2019-07-08 | Disposition: A | Discharge status: Home / Self Care | Payer: Worker's Compensation | Source: Ambulatory Visit | Attending: Orthopedic Surgery | Admitting: Orthopedic Surgery

## 2019-07-08 DIAGNOSIS — M5442 Lumbago with sciatica, left side: Secondary | ICD-10-CM

## 2019-07-08 DIAGNOSIS — M5416 Radiculopathy, lumbar region: Secondary | ICD-10-CM

## 2019-07-08 MED ORDER — METHYLPREDNISOLONE ACETATE 40 MG/ML INJ SUSP (RADIOLOG
120.0000 mg | Freq: Once | INTRAMUSCULAR | Status: AC
  Administered 2019-07-08: 120 mg via EPIDURAL

## 2019-07-08 MED ORDER — IOPAMIDOL (ISOVUE-M 200) INJECTION 41%
1.0000 mL | Freq: Once | INTRAMUSCULAR | Status: AC
  Administered 2019-07-08: 1 mL via EPIDURAL

## 2019-07-08 NOTE — Discharge Instructions (Signed)

## 2019-09-11 ENCOUNTER — Other Ambulatory Visit: Payer: Self-pay | Admitting: Orthopedic Surgery

## 2019-09-11 DIAGNOSIS — G8929 Other chronic pain: Secondary | ICD-10-CM

## 2019-09-15 ENCOUNTER — Ambulatory Visit
Admission: RE | Admit: 2019-09-15 | Discharge: 2019-09-15 | Disposition: A | Discharge status: Home / Self Care | Payer: Worker's Compensation | Source: Ambulatory Visit | Attending: Orthopedic Surgery | Admitting: Orthopedic Surgery

## 2019-09-15 ENCOUNTER — Other Ambulatory Visit: Payer: Self-pay

## 2019-09-15 DIAGNOSIS — G8929 Other chronic pain: Secondary | ICD-10-CM

## 2019-09-15 MED ORDER — IOPAMIDOL (ISOVUE-M 200) INJECTION 41%
1.0000 mL | Freq: Once | INTRAMUSCULAR | Status: AC
  Administered 2019-09-15: 1 mL via EPIDURAL

## 2019-09-15 MED ORDER — METHYLPREDNISOLONE ACETATE 40 MG/ML INJ SUSP (RADIOLOG
120.0000 mg | Freq: Once | INTRAMUSCULAR | Status: AC
  Administered 2019-09-15: 120 mg via EPIDURAL

## 2019-09-15 NOTE — Discharge Instructions (Signed)

## 2019-11-27 IMAGING — CR DG ABDOMEN 1V
1 series · 2 of 2 positions shown · non-contrast
Comparison: 12/25/2013

CLINICAL DATA: History of right-sided renal calculi

EXAM:
ABDOMEN - 1 VIEW

[Series 1: t abdomen supine · 0.14mm/px · 2 of 2 slices shown]
[im 1/2]
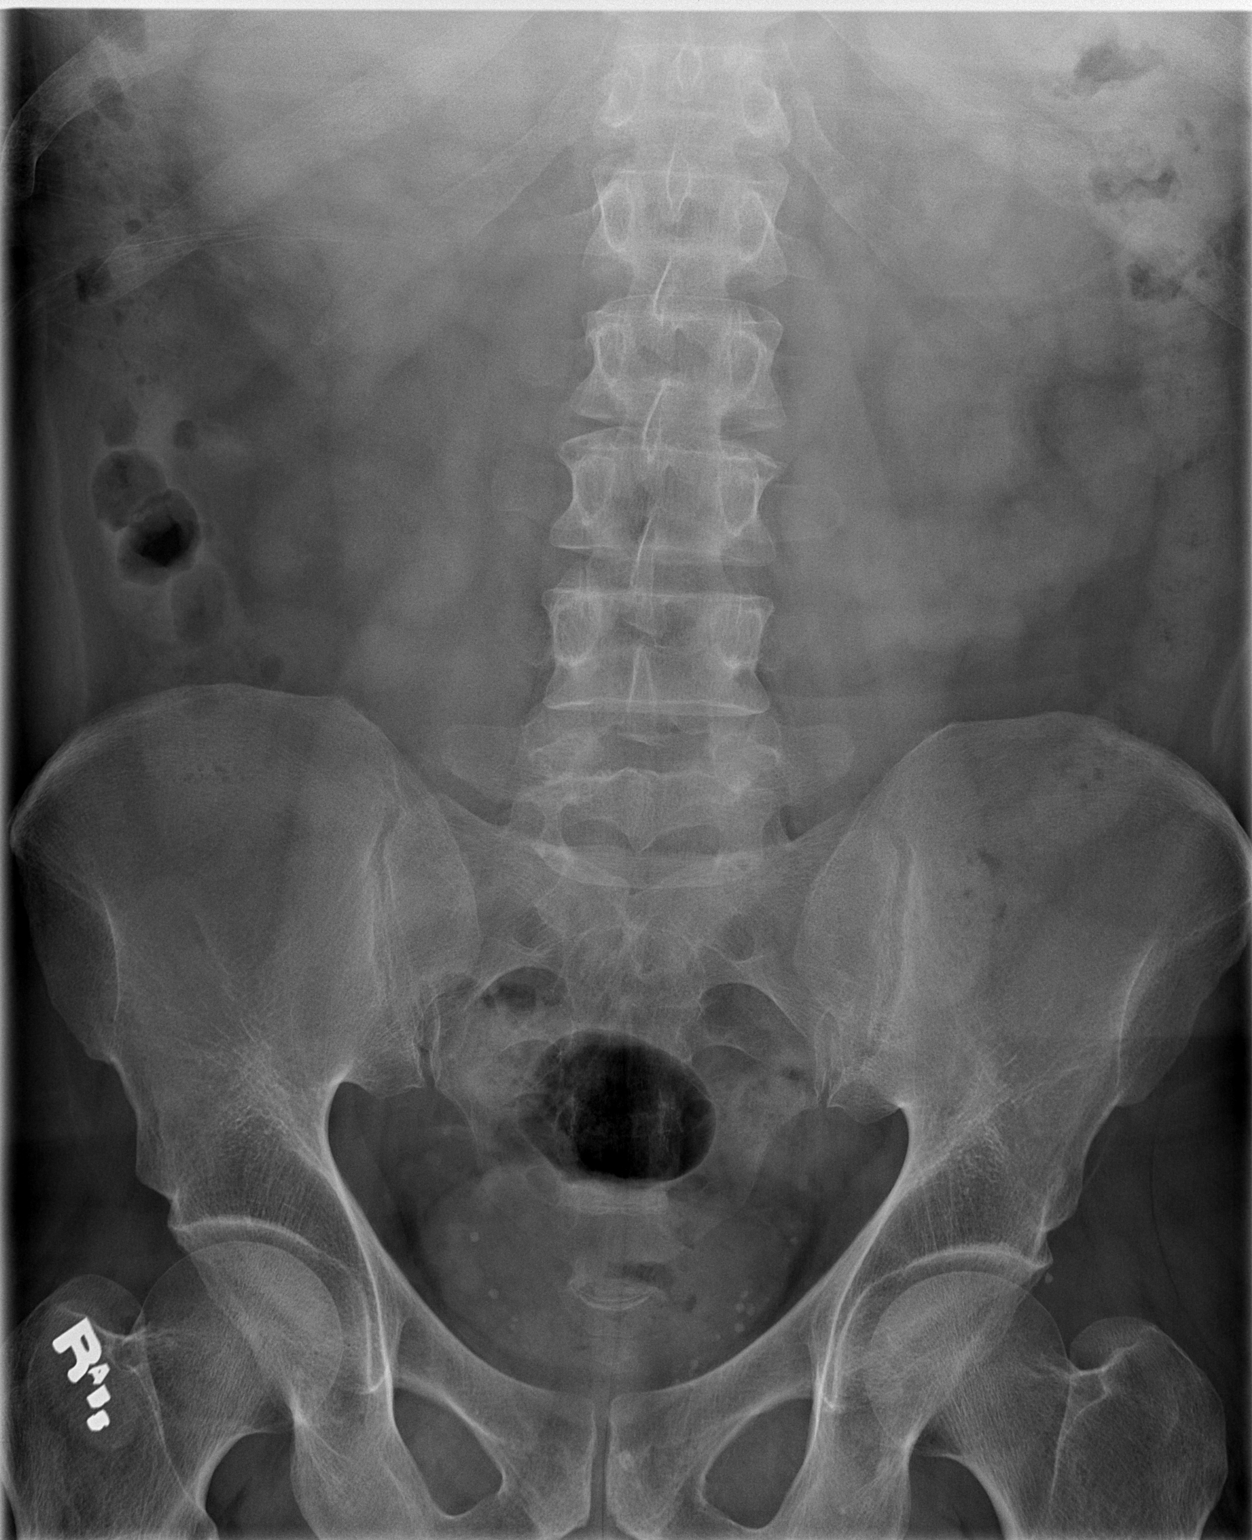
[im 2/2]
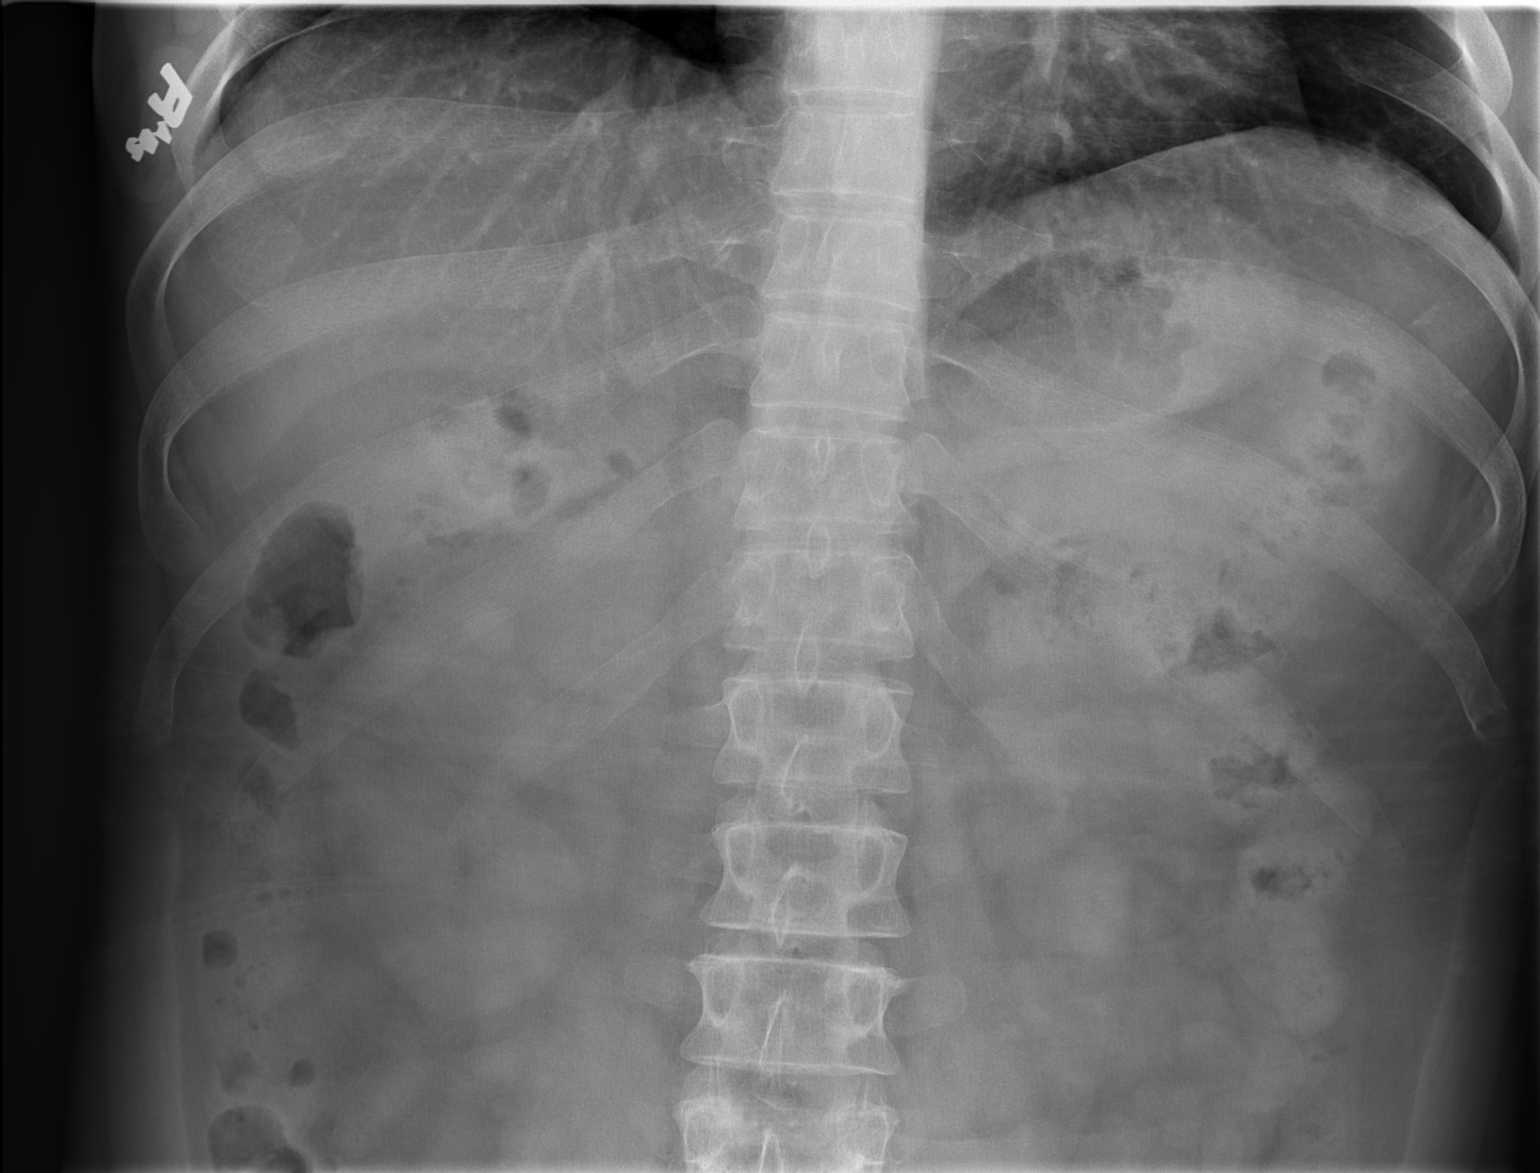

[2 of 2 positions shown; findings below may reference images not displayed]

FINDINGS: Stable pelvic calcifications attributed to phleboliths. No visible
calcification over the right renal shadow. No evident ureteral
stone.
IMPRESSION: No visualized calculi today.

## 2020-01-07 ENCOUNTER — Ambulatory Visit: Payer: Worker's Compensation | Admitting: Student in an Organized Health Care Education/Training Program

## 2020-01-11 ENCOUNTER — Telehealth: Payer: Self-pay

## 2020-01-11 NOTE — Telephone Encounter (Signed)
Thanks for letting us know. 

## 2020-01-11 NOTE — Telephone Encounter (Signed)
Pt returned nurse call for visit tomorrow

## 2020-01-11 NOTE — Telephone Encounter (Signed)
The patient called back and said he doesn't have time to go over this on the phone, he is working. He will try his best to come in a few minutes early tomorrow to get it done, but he is on a time restraint due to work.

## 2020-01-11 NOTE — Telephone Encounter (Signed)
Voicemail  Left with patient to please make sure he has his new patient questionnaire completed and bring with him tomorrow.  If he has not received it, please call and we can get the information.

## 2020-01-11 NOTE — Telephone Encounter (Signed)
Attempt to contact patient regarding new patient appointment/new patient questionnaire. No answer and left vm to have new patient packet filled out before appointment and to return call if packet not mailed.

## 2020-01-12 ENCOUNTER — Ambulatory Visit
Payer: No Typology Code available for payment source | Attending: Student in an Organized Health Care Education/Training Program | Admitting: Student in an Organized Health Care Education/Training Program

## 2020-01-12 ENCOUNTER — Other Ambulatory Visit: Payer: Self-pay

## 2020-01-12 ENCOUNTER — Encounter: Payer: Self-pay | Admitting: Student in an Organized Health Care Education/Training Program

## 2020-01-12 VITALS — BP 131/94 | HR 91 | Temp 99.0°F | Resp 16 | Ht 72.0 in | Wt 199.0 lb

## 2020-01-12 DIAGNOSIS — M4306 Spondylolysis, lumbar region: Secondary | ICD-10-CM

## 2020-01-12 DIAGNOSIS — G894 Chronic pain syndrome: Secondary | ICD-10-CM | POA: Diagnosis present

## 2020-01-12 DIAGNOSIS — M47816 Spondylosis without myelopathy or radiculopathy, lumbar region: Secondary | ICD-10-CM | POA: Diagnosis present

## 2020-01-12 DIAGNOSIS — M5136 Other intervertebral disc degeneration, lumbar region: Secondary | ICD-10-CM | POA: Diagnosis present

## 2020-01-12 NOTE — Progress Notes (Signed)
Safety precautions to be maintained throughout the outpatient stay will include: orient to surroundings, keep bed in low position, maintain call bell within reach at all times, provide assistance with transfer out of bed and ambulation.  

## 2020-01-12 NOTE — Progress Notes (Signed)
Patient: Eric Blankenship  Service Category: E/M  Provider: Gillis Santa, MD  DOB: [birth year]  DOS: 01/12/2020  Referring Provider: Meade Maw, MD  MRN: [id number]  Setting: Ambulatory outpatient  PCP: Gustavo Lah, MD  Type: New Patient  Specialty: Interventional Pain Management    Location: Office  Delivery: Face-to-face     Primary Reason(s) for Visit: Encounter for initial evaluation of one or more chronic problems (new to examiner) potentially causing chronic pain, and posing a threat to normal musculoskeletal function. (Level of risk: High) CC: New Patient (Initial Visit)  HPI  Eric Blankenship is a 44 y.o. year old, male patient, who comes today to see [place] for the first time for an initial evaluation of his chronic pain. He has Congenital renal agenesis and dysgenesis; Nephrolithiasis; Anxiety disorder; H/O renal calculi; Hypertriglyceridemia; Lumbar facet arthropathy; Lumbar spondylolysis (B/L L5); Lumbar degenerative disc disease; and Chronic pain syndrome on their problem list. Today he comes in for evaluation of his New Patient (Initial Visit)  Pain Assessment: Location: Lower, Mid Back Radiating: From mid lower back to upper left buttocks Onset: More than a month ago Duration: Chronic pain Quality: Stabbing, Sharp, Radiating, Discomfort ("spine feels like its being pulled apart") Severity: 6 /10 (subjective, self-reported pain score)  Effect on ADL: "Incredilby difficut to work, affects with sex life, walking arounf is difficult" Timing: Constant Modifying factors: Ice and sitting in a recliner for amout 30 mins. BP: (!) 131/94  HR: 91  Onset and Duration: Started with work-related injury, Date of injury: 04/2019 and Present longer than 3 months Cause of pain: Worker's Compensation Injury Severity: No change since onset, NAS-11 at its worse: 10/10, NAS-11 at its best: 4/10, NAS-11 now: 8/10 and NAS-11 on the average: 6/10 Timing: Morning, Afternoon, Night, During activity  or exercise, After activity or exercise and After a period of immobility Aggravating Factors: Bending, Kneeling, Lifiting, Prolonged sitting, Prolonged standing, Squatting, Stooping , Twisting, Walking, Walking uphill, Walking downhill and Working Alleviating Factors: Cold packs and Resting Associated Problems: Pain that wakes patient up Quality of Pain: Constant, Sharp and Stabbing Previous Examinations or Tests: The patient denies exams or test Previous Treatments: Chiropractic manipulations and Physical Therapy  The patient comes into the clinics today for the first time for a chronic pain management evaluation.   Eric Blankenship is a pleasant 44 year old male who presents with a chief complaint of low back pain with intermittent radiation to his left upper buttock area.  Previously he was having pain radiation down to his left knee but that has since improved since his previous epidural at left L4-L5 on 09/15/2019.  Of note patient's pain started related to a work injury in October 2020 when he was climbing in a crawl space and had significant low back pain after getting into a low position.  He had to crawl out of the crawl space.  Conservative measures have included heat, massage, acupuncture.  He has done physical therapy with [facility].  He has tried various medications including Norco, ibuprofen, tizanidine, gabapentin, tramadol.  He is status post 2 series of lumbar epidural steroid injections the most recent one being approximately 80% effective at left L4-L5 on 09/15/2019.  He has been evaluated by Dr. Cari Caraway with neurosurgery.  Surgery is not recommended.  Of note patient does have severe degenerative disc disease as well as L5 spondylolysis.  He is being referred here for consideration of lumbar facet medial branch nerve blocks and possible radiofrequency ablation.  Patient takes half a tablet  of hydrocodone when he has severe pain, this is prescribed by Reche Dixon.  He is not interested in chronic  opioid analgesics.  He has been told to avoid NSAIDs given that he has 1 kidney.  He is not interested any muscle relaxers as he does not like how they made him feel.   Meds   Current Outpatient Medications:  .  fenofibrate (TRICOR) 145 MG tablet, Take 1 tablet (145 mg total) by mouth daily., Disp: 30 tablet, Rfl: 1 .  HYDROcodone-acetaminophen (NORCO/VICODIN) 5-325 MG tablet, Take by mouth., Disp: , Rfl:  .  Omega-3 Fatty Acids (FISH OIL) 1000 MG CPDR, Take by mouth., Disp: , Rfl:   Imaging Review    MRI of the lumbar spine from June 16, 2019 shows multilevel degenerative changes most severe at L2-3 and L4-5. There is no central canal stenosis. There is foraminal encroachment at multiple levels without visible direct compression of the exiting nerves. There is spondylolysis at L5 bilaterally. There is no spondylolisthesis.   Complexity Note: Imaging results reviewed. Results shared with Eric Blankenship, using Layman's terms.                         ROS  Cardiovascular: No reported cardiovascular signs or symptoms such as High blood pressure, coronary artery disease, abnormal heart rate or rhythm, heart attack, blood thinner therapy or heart weakness and/or failure Pulmonary or Respiratory: No reported pulmonary signs or symptoms such as wheezing and difficulty taking a deep full breath (Asthma), difficulty blowing air out (Emphysema), coughing up mucus (Bronchitis), persistent dry cough, or temporary stoppage of breathing during sleep Neurological: No reported neurological signs or symptoms such as seizures, abnormal skin sensations, urinary and/or fecal incontinence, being born with an abnormal open spine and/or a tethered spinal cord Psychological-Psychiatric: Anxiousness Gastrointestinal: No reported gastrointestinal signs or symptoms such as vomiting or evacuating blood, reflux, heartburn, alternating episodes of diarrhea and constipation, inflamed or scarred liver, or pancreas or  irrregular and/or infrequent bowel movements Genitourinary: Passing kidney stones Hematological: No reported hematological signs or symptoms such as prolonged bleeding, low or poor functioning platelets, bruising or bleeding easily, hereditary bleeding problems, low energy levels due to low hemoglobin or being anemic Endocrine: No reported endocrine signs or symptoms such as high or low blood sugar, rapid heart rate due to high thyroid levels, obesity or weight gain due to slow thyroid or thyroid disease Rheumatologic: No reported rheumatological signs and symptoms such as fatigue, joint pain, tenderness, swelling, redness, heat, stiffness, decreased range of motion, with or without associated rash Musculoskeletal: Negative for myasthenia gravis, muscular dystrophy, multiple sclerosis or malignant hyperthermia Work History: Working full time  Allergies  Eric Blankenship is allergic to penicillins and prednisone.  Laboratory Chemistry Profile   Renal Lab Results  Component Value Date   BUN 18 08/01/2017   CREATININE 1.40 (H) 08/01/2017   BCR 13 08/01/2017   GFRAA 73 08/01/2017   GFRNONAA 63 08/01/2017   SPECGRAV 1.025 07/02/2018   PHUR 5.5 07/02/2018   PROTEINUR Negative 07/02/2018     Electrolytes Lab Results  Component Value Date   NA 142 08/01/2017   K 5.1 08/01/2017   CL 103 08/01/2017   CALCIUM 9.7 08/01/2017     Hepatic Lab Results  Component Value Date   AST 22 08/01/2017   ALT 36 08/01/2017   ALBUMIN 4.8 08/01/2017   ALKPHOS 43 08/01/2017     ID No results found for: LYMEIGGIGMAB, HIV, [place], [place],  MRSAPCR, HCVAB, PREGTESTUR, RMSFIGG, QFVRPH1IGG, QFVRPH2IGG, LYMEIGGIGMAB   Bone No results found for: VD25OH, YD741OI7OMV, [id number], EH2094BS9, 25OHVITD1, 25OHVITD2, 25OHVITD3, TESTOFREE, TESTOSTERONE   Endocrine Lab Results  Component Value Date   GLUCOSE 79 08/01/2017   GLUCOSEU Negative 07/02/2018     Neuropathy No results found for: VITAMINB12,  FOLATE, HGBA1C, HIV   CNS No results found for: COLORCSF, APPEARCSF, RBCCOUNTCSF, WBCCSF, POLYSCSF, LYMPHSCSF, EOSCSF, PROTEINCSF, GLUCCSF, JCVIRUS, CSFOLI, IGGCSF, LABACHR, ACETBL, LABACHR, ACETBL   Inflammation (CRP: Acute  ESR: Chronic) No results found for: CRP, ESRSEDRATE, LATICACIDVEN   Rheumatology No results found for: RF, ANA, LABURIC, URICUR, LYMEIGGIGMAB, LYMEABIGMQN, HLAB27   Coagulation No results found for: INR, LABPROT, APTT, PLT, DDIMER, LABHEMA, VITAMINK1, AT3   Cardiovascular No results found for: BNP, CKTOTAL, CKMB, TROPONINI, HGB, HCT, LABVMA, EPIRU, EPINEPH24HUR, NOREPRU, NOREPI24HUR, DOPARU, DOPAM24HRUR   Screening No results found for: SARSCOV2NAA, COVIDSOURCE, STAPHAUREUS, MRSAPCR, HCVAB, HIV, PREGTESTUR   Cancer No results found for: CEA, CA125, LABCA2   Allergens No results found for: ALMOND, APPLE, ASPARAGUS, AVOCADO, BANANA, BARLEY, BASIL, B[place]LE[place], GREENBEAN, LIMABEAN, WHITEBEAN, BEEFIGE, REDBEET, BLUEBERRY, BROCCOLI, CABBAGE, MELON, CARROT, CASEIN, CASHEWNUT, CAULIFLOWER, CELERY     Note: Lab results reviewed.   [place]  Drug: Eric Blankenship  reports previous drug use. Alcohol:  reports previous alcohol use. Tobacco:  reports that he has never smoked. He has quit using smokeless tobacco.  His smokeless tobacco use included snuff. Medical:  has a past medical history of Anxiety and Congenital absence of one kidney. Family: family history is not on file.  Past Surgical History:  Procedure Laterality Date  . NO PAST SURGERIES     Active Ambulatory Problems    Diagnosis Date Noted  . Congenital renal agenesis and dysgenesis 09/10/2012  . Nephrolithiasis 09/10/2012  . Anxiety disorder 02/02/2008  . H/O renal calculi 08/16/2015  . Hypertriglyceridemia 11/01/2015  . Lumbar facet arthropathy 01/12/2020  . Lumbar spondylolysis (B/L L5) 01/12/2020  . Lumbar degenerative disc disease 01/12/2020  . Chronic pain syndrome 01/12/2020   Resolved Ambulatory  Problems    Diagnosis Date Noted  . 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 Anxiety   . Congenital absence of one kidney    Constitutional Exam  General appearance: Well nourished, well developed, and well hydrated. In no apparent acute distress Vitals:   01/12/20 1303  BP: (!) 131/94  Pulse: 91  Resp: 16  Temp: 99 F (37.2 C)  TempSrc: Temporal  SpO2: 99%  Weight: 199 lb (90.3 kg)  Height: 6' (1.829 m)   BMI Assessment: Estimated body mass index is 26.99 kg/m as calculated from the following:   Height as of this encounter: 6' (1.829 m).   Weight as of this encounter: 199 lb (90.3 kg).  BMI interpretation table: BMI level Category Range association with higher incidence of chronic pain  <18 kg/m2 Underweight   18.5-24.9 kg/m2 Ideal body weight   25-29.9 kg/m2 Overweight Increased incidence by 20%  30-34.9 kg/m2 Obese (Class I) Increased incidence by 68%  35-39.9 kg/m2 Severe obesity (Class II) Increased incidence by 136%  >40 kg/m2 Extreme obesity (Class III) Increased incidence by 254%   Patient's current BMI Ideal Body weight  Body mass index is 26.99 kg/m. Ideal body weight: 77.6 kg (171 lb 1.2 oz) Adjusted ideal body weight: 82.7 kg (182 lb 3.9 oz)   BMI Readings from Last 4 Encounters:  01/12/20 26.99 kg/m  07/01/19 27.12 kg/m  03/18/19 27.12 kg/m  07/02/18 26.01 kg/m  Wt Readings from Last 4 Encounters:  01/12/20 199 lb (90.3 kg)  07/01/19 200 lb (90.7 kg)  03/18/19 200 lb (90.7 kg)  07/02/18 191 lb 12.8 oz (87 kg)    Psych/Mental status: Alert, oriented x 3 (person, place, & time)       Eyes: PERLA Respiratory: No evidence of acute respiratory distress  Cervical Spine Exam  Skin & Axial Inspection: No masses, redness, edema, swelling, or associated skin lesions Alignment: Symmetrical Functional ROM: Unrestricted ROM      Stability: No instability detected Muscle Tone/Strength: Functionally intact. No  obvious neuro-muscular anomalies detected. Sensory (Neurological): Unimpaired Palpation: No palpable anomalies              Upper Extremity (UE) Exam    Side: Right upper extremity  Side: Left upper extremity  Skin & Extremity Inspection: Skin color, temperature, and hair growth are WNL. No peripheral edema or cyanosis. No masses, redness, swelling, asymmetry, or associated skin lesions. No contractures.  Skin & Extremity Inspection: Skin color, temperature, and hair growth are WNL. No peripheral edema or cyanosis. No masses, redness, swelling, asymmetry, or associated skin lesions. No contractures.  Functional ROM: Unrestricted ROM          Functional ROM: Unrestricted ROM          Muscle Tone/Strength: Functionally intact. No obvious neuro-muscular anomalies detected.  Muscle Tone/Strength: Functionally intact. No obvious neuro-muscular anomalies detected.  Sensory (Neurological): Unimpaired          Sensory (Neurological): Unimpaired          Palpation: No palpable anomalies              Palpation: No palpable anomalies              Provocative Test(s):  Phalen's test: deferred Tinel's test: deferred Apley's scratch test (touch opposite shoulder):  Action 1 (Across chest): deferred Action 2 (Overhead): deferred Action 3 (LB reach): deferred   Provocative Test(s):  Phalen's test: deferred Tinel's test: deferred Apley's scratch test (touch opposite shoulder):  Action 1 (Across chest): deferred Action 2 (Overhead): deferred Action 3 (LB reach): deferred    Thoracic Spine Area Exam  Skin & Axial Inspection: No masses, redness, or swelling Alignment: Symmetrical Functional ROM: Unrestricted ROM Stability: No instability detected Muscle Tone/Strength: Functionally intact. No obvious neuro-muscular anomalies detected. Sensory (Neurological): Unimpaired Muscle strength & Tone: No palpable anomalies  Lumbar Exam  Skin & Axial Inspection: No masses, redness, or swelling Alignment:  Symmetrical Functional ROM: Pain restricted ROM affecting primarily the left Stability: No instability detected Muscle Tone/Strength: Functionally intact. No obvious neuro-muscular anomalies detected. Sensory (Neurological): Articular pain pattern and MSK Palpation: Complains of area being tender to palpation Left Fist Percussion Test Provocative Tests: Hyperextension/rotation test: (+) bilaterally for facet joint pain. Lumbar quadrant test (Kemp's test): (+) bilaterally for facet joint pain.  Left greater than right Lateral bending test: deferred today       Patrick's Maneuver: deferred today                   FABER* test: deferred today                   S-I anterior distraction/compression test: deferred today         S-I lateral compression test: deferred today         S-I Thigh-thrust test: deferred today         S-I Gaenslen's test: deferred today         *(  Flexion, ABduction and External Rotation)  Gait & Posture Assessment  Ambulation: Unassisted Gait: Relatively normal for age and body habitus Posture: WNL   Lower Extremity Exam    Side: Right lower extremity  Side: Left lower extremity  Stability: No instability observed          Stability: No instability observed          Skin & Extremity Inspection: Skin color, temperature, and hair growth are WNL. No peripheral edema or cyanosis. No masses, redness, swelling, asymmetry, or associated skin lesions. No contractures.  Skin & Extremity Inspection: Skin color, temperature, and hair growth are WNL. No peripheral edema or cyanosis. No masses, redness, swelling, asymmetry, or associated skin lesions. No contractures.  Functional ROM: Unrestricted ROM                  Functional ROM: Unrestricted ROM                  Muscle Tone/Strength: Functionally intact. No obvious neuro-muscular anomalies detected.  Muscle Tone/Strength: Functionally intact. No obvious neuro-muscular anomalies detected.  Sensory (Neurological): Unimpaired         Sensory (Neurological): Unimpaired        DTR: Patellar: deferred today Achilles: deferred today Plantar: deferred today  DTR: Patellar: deferred today Achilles: deferred today Plantar: deferred today  Palpation: No palpable anomalies  Palpation: No palpable anomalies  5 out of 5 strength bilateral lower extremity: Plantar flexion, dorsiflexion, knee flexion, knee extension.  Assessment  Primary Diagnosis & Pertinent Problem List: The primary encounter diagnosis was Lumbar spondylosis. Diagnoses of Lumbar spondylolysis (B/L L5), Lumbar facet arthropathy, Lumbar degenerative disc disease, and Chronic pain syndrome were also pertinent to this visit.  Visit Diagnosis (New problems to examiner): 1. Lumbar spondylosis   2. Lumbar spondylolysis (B/L L5)   3. Lumbar facet arthropathy   4. Lumbar degenerative disc disease   5. Chronic pain syndrome    Plan of Care (Initial workup plan)   Eric Blankenship has a history of greater than 3 months of moderate to severe pain which is resulted in functional impairment.  The patient has tried various conservative therapeutic options such as Tylenol, muscle relaxants, physical therapy which was inadequately effective.  Patient's pain is predominantly axial with physical exam findings suggestive of facet arthropathy.  Patient's lumbar MRI also shows lumbar facet arthropathy as well as spondylolysis at L5 bilaterally.  Lumbar facet medial branch nerve blocks were discussed with the patient.  Risks and benefits were reviewed.  Patient would like to proceed with bilateral L3, L4, L5 medial branch nerve block.    Procedure Orders     LUMBAR FACET(MEDIAL BRANCH NERVE BLOCK) MBNB   Interventional management options: Eric Blankenship was informed that there is no guarantee that he would be a candidate for interventional therapies. The decision will be based on the results of diagnostic studies, as well as Eric Blankenship risk profile.  Procedure(s) under  consideration:  Lumbar facet medial branch nerve blocks Lumbar radiofrequency ablation Sprint peripheral nerve stimulation of lumbar medial branch nerves Spinal cord stimulator trial   Provider-requested follow-up: Return in about 2 weeks (around 01/26/2020) for L3-L5 Fct blcks with sedation.  No future appointments.  Note by: Bilal Lateef, MD Date: 01/12/2020; Time: 3:18 PM 

## 2020-01-12 NOTE — Patient Instructions (Signed)
____________________________________________________________________________________________  Preparing for Procedure with Sedation  Procedure appointments are limited to planned procedures: . No Prescription Refills. . No disability issues will be discussed. . No medication changes will be discussed.  Instructions: . Oral Intake: Do not eat or drink anything for at least 8 hours prior to your procedure. (Exception: Blood Pressure Medication. See below.) . Transportation: Unless otherwise stated by your physician, you may drive yourself after the procedure. . Blood Pressure Medicine: Do not forget to take your blood pressure medicine with a sip of water the morning of the procedure. If your Diastolic (lower reading)is above 100 mmHg, elective cases will be cancelled/rescheduled. . Blood thinners: These will need to be stopped for procedures. Notify our staff if you are taking any blood thinners. Depending on which one you take, there will be specific instructions on how and when to stop it. . Diabetics on insulin: Notify the staff so that you can be scheduled 1st case in the morning. If your diabetes requires high dose insulin, take only  of your normal insulin dose the morning of the procedure and notify the staff that you have done so. . Preventing infections: Shower with an antibacterial soap the morning of your procedure. . Build-up your immune system: Take 1000 mg of Vitamin C with every meal (3 times a day) the day prior to your procedure. . Antibiotics: Inform the staff if you have a condition or reason that requires you to take antibiotics before dental procedures. . Pregnancy: If you are pregnant, call and cancel the procedure. . Sickness: If you have a cold, fever, or any active infections, call and cancel the procedure. . Arrival: You must be in the facility at least 30 minutes prior to your scheduled procedure. . Children: Do not bring children with you. . Dress appropriately:  Bring dark clothing that you would not mind if they get stained. . Valuables: Do not bring any jewelry or valuables.  Reasons to call and reschedule or cancel your procedure: (Following these recommendations will minimize the risk of a serious complication.) . Surgeries: Avoid having procedures within 2 weeks of any surgery. (Avoid for 2 weeks before or after any surgery). . Flu Shots: Avoid having procedures within 2 weeks of a flu shots or . (Avoid for 2 weeks before or after immunizations). . Barium: Avoid having a procedure within 7-10 days after having had a radiological study involving the use of radiological contrast. (Myelograms, Barium swallow or enema study). . Heart attacks: Avoid any elective procedures or surgeries for the initial 6 months after a "Myocardial Infarction" (Heart Attack). . Blood thinners: It is imperative that you stop these medications before procedures. Let us know if you if you take any blood thinner.  . Infection: Avoid procedures during or within two weeks of an infection (including chest colds or gastrointestinal problems). Symptoms associated with infections include: Localized redness, fever, chills, night sweats or profuse sweating, burning sensation when voiding, cough, congestion, stuffiness, runny nose, sore throat, diarrhea, nausea, vomiting, cold or Flu symptoms, recent or current infections. It is specially important if the infection is over the area that we intend to treat. . Heart and lung problems: Symptoms that may suggest an active cardiopulmonary problem include: cough, chest pain, breathing difficulties or shortness of breath, dizziness, ankle swelling, uncontrolled high or unusually low blood pressure, and/or palpitations. If you are experiencing any of these symptoms, cancel your procedure and contact your primary care physician for an evaluation.  Remember:  Regular Business hours are:    Monday to Thursday 8:00 AM to 4:00 PM  Provider's  Schedule: Milinda Pointer, MD:  Procedure days: Tuesday and Thursday 7:30 AM to 4:00 PM  Gillis Santa, MD:  Procedure days: Monday and Wednesday 7:30 AM to 4:00 PM ____________________________________________________________________________________________  Facet Blocks Patient Information  Description: The facets are joints in the spine between the vertebrae.  Like any joints in the body, facets can become irritated and painful.  Arthritis can also effect the facets.  By injecting steroids and local anesthetic in and around these joints, we can temporarily block the nerve supply to them.  Steroids act directly on irritated nerves and tissues to reduce selling and inflammation which often leads to decreased pain.  Facet blocks may be done anywhere along the spine from the neck to the low back depending upon the location of your pain.   After numbing the skin with local anesthetic (like Novocaine), a small needle is passed onto the facet joints under x-ray guidance.  You may experience a sensation of pressure while this is being done.  The entire block usually lasts about 15-25 minutes.   Conditions which may be treated by facet blocks:   Low back/buttock pain  Neck/shoulder pain  Certain types of headaches  Preparation for the injection:  1. Do not eat any solid food or dairy products within 8 hours of your appointment. 2. You may drink clear liquid up to 3 hours before appointment.  Clear liquids include water, black coffee, juice or soda.  No milk or cream please. 3. You may take your regular medication, including pain medications, with a sip of water before your appointment.  Diabetics should hold regular insulin (if taken separately) and take 1/2 normal NPH dose the morning of the procedure.  Carry some sugar containing items with you to your appointment. 4. A driver must accompany you and be prepared to drive you home after your procedure. 5. Bring all your current medications  with you. 6. An IV may be inserted and sedation may be given at the discretion of the physician. 7. A blood pressure cuff, EKG and other monitors will often be applied during the procedure.  Some patients may need to have extra oxygen administered for a short period. 8. You will be asked to provide medical information, including your allergies and medications, prior to the procedure.  We must know immediately if you are taking blood thinners (like Coumadin/Warfarin) or if you are allergic to IV iodine contrast (dye).  We must know if you could possible be pregnant.  Possible side-effects:   Bleeding from needle site  Infection (rare, may require surgery)  Nerve injury (rare)  Numbness & tingling (temporary)  Difficulty urinating (rare, temporary)  Spinal headache (a headache worse with upright posture)  Light-headedness (temporary)  Pain at injection site (serveral days)  Decreased blood pressure (rare, temporary)  Weakness in arm/leg (temporary)  Pressure sensation in back/neck (temporary)   Call if you experience:   Fever/chills associated with headache or increased back/neck pain  Headache worsened by an upright position  New onset, weakness or numbness of an extremity below the injection site  Hives or difficulty breathing (go to the emergency room)  Inflammation or drainage at the injection site(s)  Severe back/neck pain greater than usual  New symptoms which are concerning to you  Please note:  Although the local anesthetic injected can often make your back or neck feel good for several hours after the injection, the pain will likely return. It takes 3-7  days for steroids to work.  You may not notice any pain relief for at least one week.  If effective, we will often do a series of 2-3 injections spaced 3-6 weeks apart to maximally decrease your pain.  After the initial series, you may be a candidate for a more permanent nerve block of the facets.  If you  have any questions, please call [phone] [facility] 

## 2020-12-02 IMAGING — XA Imaging study
2 series · 2 of 2 positions shown · non-contrast
Comparison: none

CLINICAL DATA: Left-sided low back pain and left-sided sciatica.
L4-5 injection requested.

[Series 1: ortho adipose · 1 of 1 slices shown (1 of 2)]
[im 1/1]
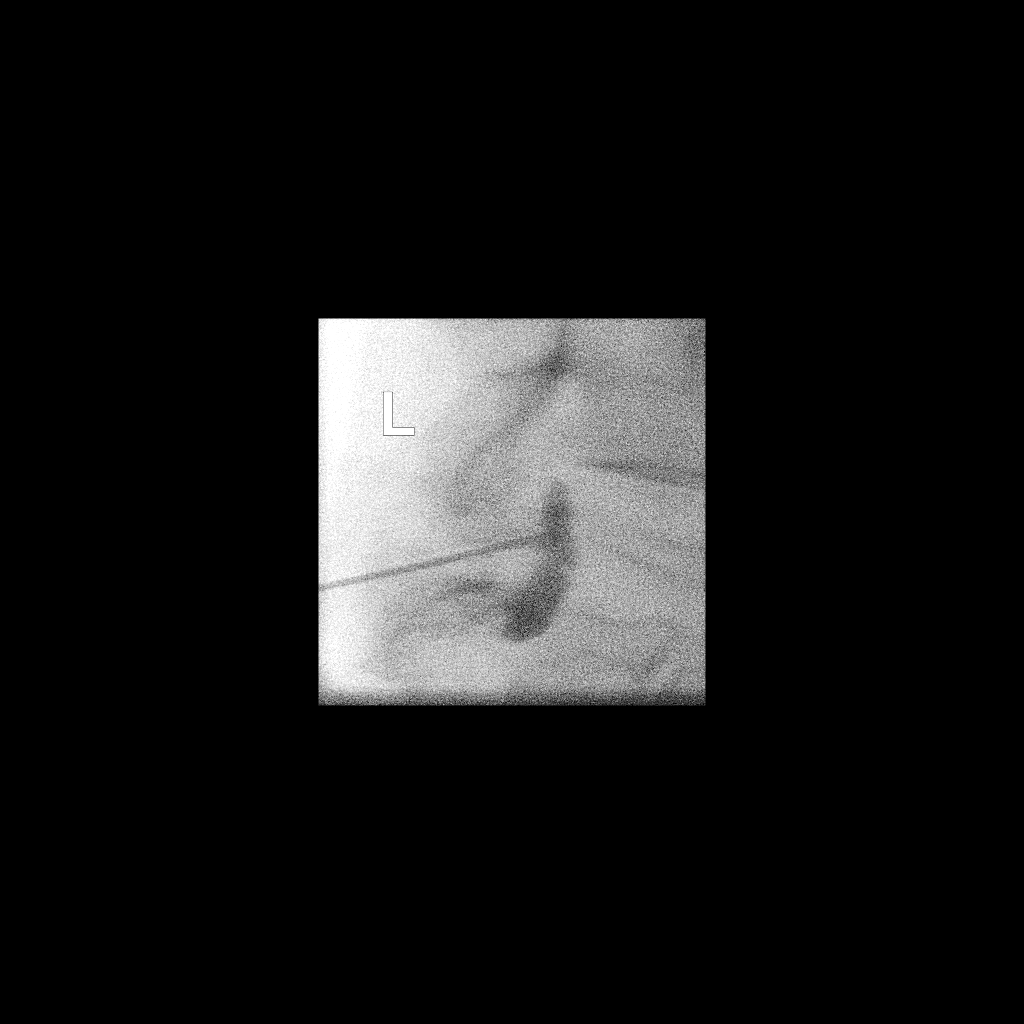

[Series 2: ortho adipose · 1 of 1 slices shown (2 of 2)]
[im 1/1]
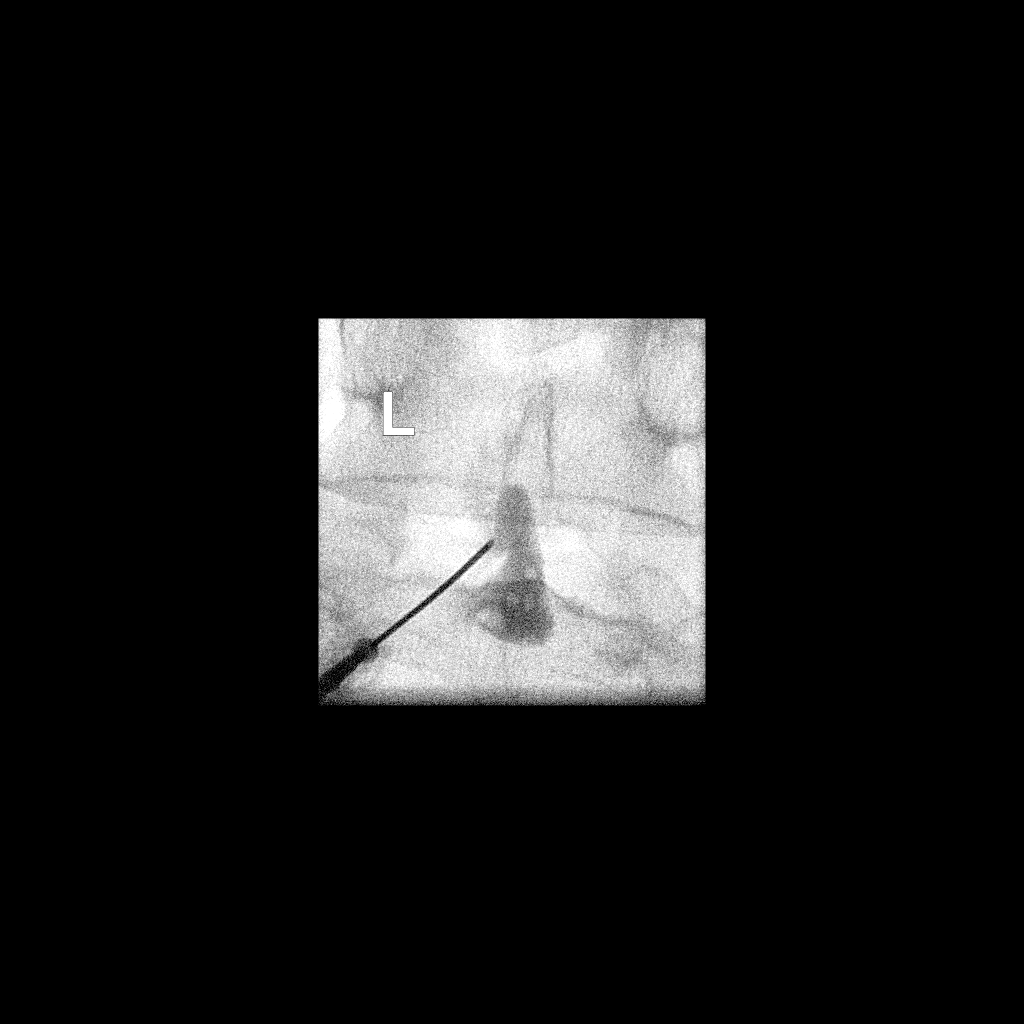

[2 of 2 positions shown; findings below may reference images not displayed]

FLUOROSCOPY TIME:  0 minutes 28 seconds. 16.81 micro gray meter
squared

PROCEDURE:
The procedure, risks, benefits, and alternatives were explained to
the patient. Questions regarding the procedure were encouraged and
answered. The patient understands and consents to the procedure.

LUMBAR EPIDURAL INJECTION:

An interlaminar approach was performed on the left at L4-5. The
overlying skin was cleansed and anesthetized. A 20 gauge epidural
needle was advanced using loss-of-resistance technique.

DIAGNOSTIC EPIDURAL INJECTION:

Injection of Isovue-M 200 shows a good epidural pattern with spread
above and below the level of needle placement, primarily on the
left. No vascular opacification is seen.

THERAPEUTIC EPIDURAL INJECTION:

One hundred twenty mg of Depo-Medrol mixed with 3 cc 1% lidocaine
were instilled. The procedure was well-tolerated, and the patient
was discharged thirty minutes following the injection in good
condition.

COMPLICATIONS:
None
IMPRESSION: Technically successful epidural injection on the left at L4-5 # 1.

## 2021-05-31 ENCOUNTER — Ambulatory Visit
Payer: No Typology Code available for payment source | Admitting: Student in an Organized Health Care Education/Training Program

## 2021-06-02 ENCOUNTER — Encounter: Payer: Self-pay | Admitting: Student in an Organized Health Care Education/Training Program

## 2021-06-05 ENCOUNTER — Ambulatory Visit
Admission: RE | Admit: 2021-06-05 | Discharge: 2021-06-05 | Disposition: A | Discharge status: Home / Self Care | Payer: Self-pay | Source: Ambulatory Visit | Attending: Student in an Organized Health Care Education/Training Program | Admitting: Student in an Organized Health Care Education/Training Program

## 2021-06-05 ENCOUNTER — Other Ambulatory Visit: Payer: Self-pay

## 2021-06-05 ENCOUNTER — Encounter: Payer: Self-pay | Admitting: Student in an Organized Health Care Education/Training Program

## 2021-06-05 ENCOUNTER — Ambulatory Visit
Payer: No Typology Code available for payment source | Attending: Student in an Organized Health Care Education/Training Program | Admitting: Student in an Organized Health Care Education/Training Program

## 2021-06-05 VITALS — BP 119/95 | HR 67 | Temp 97.7°F | Resp 15 | Ht 72.0 in | Wt 199.0 lb

## 2021-06-05 DIAGNOSIS — M4306 Spondylolysis, lumbar region: Secondary | ICD-10-CM | POA: Diagnosis not present

## 2021-06-05 DIAGNOSIS — G894 Chronic pain syndrome: Secondary | ICD-10-CM | POA: Insufficient documentation

## 2021-06-05 DIAGNOSIS — M47816 Spondylosis without myelopathy or radiculopathy, lumbar region: Secondary | ICD-10-CM | POA: Insufficient documentation

## 2021-06-05 MED ORDER — LIDOCAINE HCL 2 % IJ SOLN
20.0000 mL | Freq: Once | INTRAMUSCULAR | Status: AC
  Administered 2021-06-05: 400 mg

## 2021-06-05 MED ORDER — DEXAMETHASONE SODIUM PHOSPHATE 10 MG/ML IJ SOLN
10.0000 mg | Freq: Once | INTRAMUSCULAR | Status: AC
  Administered 2021-06-05: 10 mg
  Filled 2021-06-05: qty 1

## 2021-06-05 MED ORDER — MIDAZOLAM HCL 5 MG/5ML IJ SOLN
INTRAMUSCULAR | Status: AC
  Filled 2021-06-05: qty 5

## 2021-06-05 MED ORDER — MIDAZOLAM HCL 5 MG/5ML IJ SOLN
0.5000 mg | Freq: Once | INTRAMUSCULAR | Status: AC
  Administered 2021-06-05: 1.5 mg via INTRAVENOUS

## 2021-06-05 MED ORDER — ROPIVACAINE HCL 2 MG/ML IJ SOLN
9.0000 mL | Freq: Once | INTRAMUSCULAR | Status: AC
  Administered 2021-06-05: 9 mL via PERINEURAL

## 2021-06-05 NOTE — Patient Instructions (Signed)

## 2021-06-05 NOTE — Progress Notes (Signed)
PROVIDER NOTE: Information contained herein reflects review and annotations entered in association with encounter. Interpretation of such information and data should be left to medically-trained personnel. Information provided to patient can be located elsewhere in the medical record under "Patient Instructions". Document created using STT-dictation technology, any transcriptional errors that may result from process are unintentional.    Patient: Eric Blankenship  Service Category: Procedure Provider: Edward Jolly, MD DOB: 07-Jan-1976 DOS: 06/05/2021 Location: [facility] MRN: [id number] Setting: Ambulatory - outpatient Referring Provider: [name], MD Type: Established Patient Specialty: Interventional Pain Management PCP: [name], MD  Primary Reason for Visit: Interventional Pain Management Treatment. CC: Back Pain (Lumbar bilateral  left is worse. )   Procedure:          Type: Lumbar Facet, Medial Branch Block(s) #1  Primary Purpose: Diagnostic/Therapeutic Region: Posterolateral Lumbosacral Spine Level:L3, L4, L5,  Medial Branch Level(s). Injecting these levels blocks the L3-4, L4-5, lumbar facet joints. Laterality: Bilateral Anesthesia: Local (1-2% Lidocaine)  Anxiolysis: IV  Sedation: Minimal  Guidance: Fluoroscopy          Indications: 1. Lumbar spondylosis   2. Lumbar spondylolysis (B/L L5)   3. Chronic pain syndrome    Pain Score: Pre-procedure: 8 /10 Post-procedure: 7 /10    Position: Prone  Pre-op H&P Assessment:  Eric Blankenship is a 45 y.o. (year old), male patient, seen today for interventional treatment. He  has a past surgical history that includes No past surgeries. Eric Blankenship has a current medication list which includes the following prescription(s): albuterol, fenofibrate, gabapentin, hydrocodone-acetaminophen, fish oil, and [DISCONTINUED] clonazepam. His primarily concern today is the Back Pain (Lumbar bilateral  left is worse.  )  From initial HPI: Eric Blankenship is a pleasant 45 year old male who presents with a chief complaint of low back pain with intermittent radiation to his left upper buttock area.  Previously he was having pain radiation down to his left knee but that has since improved since his previous epidural at left L4-L5 on 09/15/2019.  Of note patient's pain started related to a work injury in October 2020 when he was climbing in a crawl space and had significant low back pain after getting into a low position.  He had to crawl out of the crawl space.  Conservative measures have included heat, massage, acupuncture.  He has done physical therapy with [facility].  He has tried various medications including Norco, ibuprofen, tizanidine, gabapentin, tramadol.  He is status post 2 series of lumbar epidural steroid injections the most recent one being approximately 80% effective at left L4-L5 on 09/15/2019.  He has been evaluated by Dr. Marcell Barlow with neurosurgery.  Surgery is not recommended.  Of note patient does have severe degenerative disc disease as well as L5 spondylolysis.  He is being referred here for consideration of lumbar facet medial branch nerve blocks and possible radiofrequency ablation.  Patient takes half a tablet of hydrocodone when he has severe pain, this is prescribed by Dedra Skeens.  He is not interested in chronic opioid analgesics.  He has been told to avoid NSAIDs given that he has 1 kidney.  He is not interested any muscle relaxers as he does not like how they made him feel.    Initial Vital Signs:  Pulse/HCG Rate: 83  Temp: 97.7 F (36.5 C) Resp: 16 BP: (!) 125/99 SpO2: 100 %  BMI: Estimated body mass index is 26.99 kg/m as calculated from the following:   Height as of this encounter: 6' (  1.829 m).   Weight as of this encounter: 199 lb (90.3 kg).  Risk Assessment: Allergies: Reviewed. He is allergic to penicillins and prednisone.  Allergy Precautions: None required Coagulopathies: Reviewed. None  identified.  Blood-thinner therapy: None at this time Active Infection(s): Reviewed. None identified. Eric Blankenship is afebrile  Site Confirmation: Eric Blankenship was asked to confirm the procedure and laterality before marking the site Procedure checklist: Completed Consent: Before the procedure and under the influence of no sedative(s), amnesic(s), or anxiolytics, the patient was informed of the treatment options, risks and possible complications. To fulfill our ethical and legal obligations, as recommended by the American Medical Association's Code of Ethics, I have informed the patient of my clinical impression; the nature and purpose of the treatment or procedure; the risks, benefits, and possible complications of the intervention; the alternatives, including doing nothing; the risk(s) and benefit(s) of the alternative treatment(s) or procedure(s); and the risk(s) and benefit(s) of doing nothing. The patient was provided information about the general risks and possible complications associated with the procedure. These may include, but are not limited to: failure to achieve desired goals, infection, bleeding, organ or nerve damage, allergic reactions, paralysis, and death. In addition, the patient was informed of those risks and complications associated to Spine-related procedures, such as failure to decrease pain; infection (i.e.: Meningitis, epidural or intraspinal abscess); bleeding (i.e.: epidural hematoma, subarachnoid hemorrhage, or any other type of intraspinal or peri-dural bleeding); organ or nerve damage (i.e.: Any type of peripheral nerve, nerve root, or spinal cord injury) with subsequent damage to sensory, motor, and/or autonomic systems, resulting in permanent pain, numbness, and/or weakness of one or several areas of the body; allergic reactions; (i.e.: anaphylactic reaction); and/or death. Furthermore, the patient was informed of those risks and complications associated with the medications.  These include, but are not limited to: allergic reactions (i.e.: anaphylactic or anaphylactoid reaction(s)); adrenal axis suppression; blood sugar elevation that in diabetics may result in ketoacidosis or comma; water retention that in patients with history of congestive heart failure may result in shortness of breath, pulmonary edema, and decompensation with resultant heart failure; weight gain; swelling or edema; medication-induced neural toxicity; particulate matter embolism and blood vessel occlusion with resultant organ, and/or nervous system infarction; and/or aseptic necrosis of one or more joints. Finally, the patient was informed that Medicine is not an exact science; therefore, there is also the possibility of unforeseen or unpredictable risks and/or possible complications that may result in a catastrophic outcome. The patient indicated having understood very clearly. We have given the patient no guarantees and we have made no promises. Enough time was given to the patient to ask questions, all of which were answered to the patient's satisfaction. Mr. Pedrosa has indicated that he wanted to continue with the procedure. Attestation: I, the ordering provider, attest that I have discussed with the patient the benefits, risks, side-effects, alternatives, likelihood of achieving goals, and potential problems during recovery for the procedure that I have provided informed consent. Date  Time: 06/05/2021  8:35 AM  Pre-Procedure Preparation:  Monitoring: As per clinic protocol. Respiration, ETCO2, SpO2, BP, heart rate and rhythm monitor placed and checked for adequate function Safety Precautions: Patient was assessed for positional comfort and pressure points before starting the procedure. Time-out: I initiated and conducted the "Time-out" before starting the procedure, as per protocol. The patient was asked to participate by confirming the accuracy of the "Time Out" information. Verification of the correct  person, site, and procedure were performed and  confirmed by me, the nursing staff, and the patient. "Time-out" conducted as per Joint Commission's Universal Protocol (UP.01.01.01). Time: 0926  Description of Procedure:          Laterality: Bilateral. The procedure was performed in identical fashion on both sides. Levels:   L3, L4, L5,Medial Branch Level(s) Area Prepped: Posterior Lumbosacral Region DuraPrep (Iodine Povacrylex [0.7% available iodine] and Isopropyl Alcohol, 74% w/w) Safety Precautions: Aspiration looking for blood return was conducted prior to all injections. At no point did we inject any substances, as a needle was being advanced. Before injecting, the patient was told to immediately notify me if he was experiencing any new onset of "ringing in the ears, or metallic taste in the mouth". No attempts were made at seeking any paresthesias. Safe injection practices and needle disposal techniques used. Medications properly checked for expiration dates. SDV (single dose vial) medications used. After the completion of the procedure, all disposable equipment used was discarded in the proper designated medical waste containers. Local Anesthesia: Protocol guidelines were followed. The patient was positioned over the fluoroscopy table. The area was prepped in the usual manner. The time-out was completed. The target area was identified using fluoroscopy. A 12-in long, straight, sterile hemostat was used with fluoroscopic guidance to locate the targets for each level blocked. Once located, the skin was marked with an approved surgical skin marker. Once all sites were marked, the skin (epidermis, dermis, and hypodermis), as well as deeper tissues (fat, connective tissue and muscle) were infiltrated with a small amount of a short-acting local anesthetic, loaded on a 10cc syringe with a 25G, 1.5-in  Needle. An appropriate amount of time was allowed for local anesthetics to take effect before proceeding to  the next step. Local Anesthetic: Lidocaine 2.0% The unused portion of the local anesthetic was discarded in the proper designated containers. Technical explanation of process:  L3 Medial Branch Nerve Block (MBB): The target area for the L3 medial branch is at the junction of the postero-lateral aspect of the superior articular process and the superior, posterior, and medial edge of the transverse process of L4. Under fluoroscopic guidance, a Quincke needle was inserted until contact was made with os over the superior postero-lateral aspect of the pedicular shadow (target area). After negative aspiration for blood, 41mL of the nerve block solution was injected without difficulty or complication. The needle was removed intact. L4 Medial Branch Nerve Block (MBB): The target area for the L4 medial branch is at the junction of the postero-lateral aspect of the superior articular process and the superior, posterior, and medial edge of the transverse process of L5. Under fluoroscopic guidance, a Quincke needle was inserted until contact was made with os over the superior postero-lateral aspect of the pedicular shadow (target area). After negative aspiration for blood, 33mL of the nerve block solution was injected without difficulty or complication. The needle was removed intact. L5 Medial Branch Nerve Block (MBB): The target area for the L5 medial branch is at the junction of the postero-lateral aspect of the superior articular process and the superior, posterior, and medial edge of the sacral ala. Under fluoroscopic guidance, a Quincke needle was inserted until contact was made with os over the superior postero-lateral aspect of the pedicular shadow (target area). After negative aspiration for blood, 65mL of the nerve block solution was injected without difficulty or complication. The needle was removed intact.  Nerve block solution: 12 cc solution made of 10 cc of 0.2% ropivacaine, 2 cc of Decadron 10 mg/cc.  2 cc  injected at each level above bilaterally. Procedural Needles: 22-gauge, 3.5-inch, Quincke needles used for all levels.       Illustration of the posterior view of the lumbar spine and the posterior neural structures. Laminae of L2 through S1 are labeled. DPRL5, dorsal primary ramus of L5; DPRS1, dorsal primary ramus of S1; DPR3, dorsal primary ramus of L3; FJ, facet (zygapophyseal) joint L3-L4; I, inferior articular process of L4; LB1, lateral branch of dorsal primary ramus of L1; IAB, inferior articular branches from L3 medial branch (supplies L4-L5 facet joint); IBP, intermediate branch plexus; MB3, medial branch of dorsal primary ramus of L3; NR3, third lumbar nerve root; S, superior articular process of L5; SAB, superior articular branches from L4 (supplies L4-5 facet joint also); TP3, transverse process of L3.  Vitals:   06/05/21 0933 06/05/21 0938 06/05/21 0943 06/05/21 0948  BP: (!) 133/98 (!) 133/96 (!) 138/98 (!) 119/95  Pulse: 69 72 70 67  Resp: 15 13 15 15   Temp:      TempSrc:      SpO2: 100% 100% 100% 100%  Weight:      Height:         Start Time: 0926 hrs. End Time: 0941 hrs.  Imaging Guidance (Spinal):          Type of Imaging Technique: Fluoroscopy Guidance (Spinal) Indication(s): Assistance in needle guidance and placement for procedures requiring needle placement in or near specific anatomical locations not easily accessible without such assistance. Exposure Time: Please see nurses notes. Contrast: None used. Fluoroscopic Guidance: I was personally present during the use of fluoroscopy. "Tunnel Vision Technique" used to obtain the best possible view of the target area. Parallax error corrected before commencing the procedure. "Direction-depth-direction" technique used to introduce the needle under continuous pulsed fluoroscopy. Once target was reached, antero-posterior, oblique, and lateral fluoroscopic projection used confirm needle placement in all planes. Images  permanently stored in EMR. Interpretation: No contrast injected. I personally interpreted the imaging intraoperatively. Adequate needle placement confirmed in multiple planes. Permanent images saved into the patient's record.   Post-operative Assessment:  Post-procedure Vital Signs:  Pulse/HCG Rate: 67  Temp: 97.7 F (36.5 C) Resp: 15 BP: (!) 119/95 SpO2: 100 %  EBL: None  Complications: No immediate post-treatment complications observed by team, or reported by patient.  Note: The patient tolerated the entire procedure well. A repeat set of vitals were taken after the procedure and the patient was kept under observation following institutional policy, for this type of procedure. Post-procedural neurological assessment was performed, showing return to baseline, prior to discharge. The patient was provided with post-procedure discharge instructions, including a section on how to identify potential problems. Should any problems arise concerning this procedure, the patient was given instructions to immediately contact , at any time, without hesitation. In any case, we plan to contact the patient by telephone for a follow-up status report regarding this interventional procedure.  Comments:  No additional relevant information.  Plan of Care  Orders:  Orders Placed This Encounter  Procedures   DG PAIN CLINIC C-ARM 1-60 MIN NO REPORT    Intraoperative interpretation by procedural physician at [facility].    Standing Status:   Standing    Number of Occurrences:   1    Order Specific Question:   Reason for exam:    Answer:   Assistance in needle guidance and placement for procedures requiring needle placement in or near specific anatomical locations not easily accessible without such assistance.  Medications ordered for procedure: Meds ordered this encounter  Medications   lidocaine (XYLOCAINE) 2 % (with pres) injection 400 mg   dexamethasone (DECADRON) injection 10 mg    dexamethasone (DECADRON) injection 10 mg   ropivacaine (PF) 2 mg/mL (0.2%) (NAROPIN) injection 9 mL   ropivacaine (PF) 2 mg/mL (0.2%) (NAROPIN) injection 9 mL   midazolam (VERSED) 5 MG/5ML injection 0.5-2 mg    Make sure Flumazenil is available in the pyxis when using this medication. If oversedation occurs, administer 0.2 mg IV over 15 sec. If after 45 sec no response, administer 0.2 mg again over 1 min; may repeat at 1 min intervals; not to exceed 4 doses (1 mg)    Medications administered: We administered lidocaine, dexamethasone, dexamethasone, ropivacaine (PF) 2 mg/mL (0.2%), ropivacaine (PF) 2 mg/mL (0.2%), and midazolam.  See the medical record for exact dosing, route, and time of administration.  Follow-up plan:   Return in about 4 weeks (around 07/03/2021) for PPE  VV.     Disposition: Discharge home  Discharge (Date  Time): 06/05/2021; 1000 hrs.   Primary Care Physician: [name], MD Location: [facility] Note by: Edward Jolly, MD Date: 06/05/2021; Time: 9:56 AM  Disclaimer:  Medicine is not an exact science. The only guarantee in medicine is that nothing is guaranteed. It is important to note that the decision to proceed with this intervention was based on the information collected from the patient. The Data and conclusions were drawn from the patient's questionnaire, the interview, and the physical examination. Because the information was provided in large part by the patient, it cannot be guaranteed that it has not been purposely or unconsciously manipulated. Every effort has been made to obtain as much relevant data as possible for this evaluation. It is important to note that the conclusions that lead to this procedure are derived in large part from the available data. Always take into account that the treatment will also be dependent on availability of resources and existing treatment guidelines, considered by other Pain Management  Practitioners as being common knowledge and practice, at the time of the intervention. For Medico-Legal purposes, it is also important to point out that variation in procedural techniques and pharmacological choices are the acceptable norm. The indications, contraindications, technique, and results of the above procedure should only be interpreted and judged by a Board-Certified Interventional Pain Specialist with extensive familiarity and expertise in the same exact procedure and technique.

## 2021-06-05 NOTE — Progress Notes (Signed)
Safety precautions to be maintained throughout the outpatient stay will include: orient to surroundings, keep bed in low position, maintain call bell within reach at all times, provide assistance with transfer out of bed and ambulation.  

## 2021-06-06 ENCOUNTER — Telehealth: Payer: Self-pay | Admitting: *Deleted

## 2021-06-06 NOTE — Telephone Encounter (Signed)
Called for post procedure check. 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 States HR was 126bpm with other VSS. Denies any other symptoms. Staff at fire dept instructed him to take his clonazepam that he has for anxiety and patient states he did that and within the hour his HR had come down to the 80s. States feeling fine this AM with no other complaints. Instructed him to call for any concerns/issues.

## 2021-06-30 ENCOUNTER — Encounter: Payer: Self-pay | Admitting: Student in an Organized Health Care Education/Training Program

## 2021-07-03 ENCOUNTER — Other Ambulatory Visit: Payer: Self-pay

## 2021-07-03 ENCOUNTER — Ambulatory Visit
Payer: No Typology Code available for payment source | Attending: Student in an Organized Health Care Education/Training Program | Admitting: Student in an Organized Health Care Education/Training Program

## 2021-07-03 ENCOUNTER — Encounter: Payer: Self-pay | Admitting: Student in an Organized Health Care Education/Training Program

## 2021-07-03 DIAGNOSIS — M47816 Spondylosis without myelopathy or radiculopathy, lumbar region: Secondary | ICD-10-CM

## 2021-07-03 DIAGNOSIS — M4306 Spondylolysis, lumbar region: Secondary | ICD-10-CM | POA: Diagnosis not present

## 2021-07-03 DIAGNOSIS — G894 Chronic pain syndrome: Secondary | ICD-10-CM

## 2021-07-03 NOTE — Progress Notes (Signed)
Patient: Eric Blankenship  Service Category: E/M  Provider: Gillis Santa, MD  DOB: 1976-01-17  DOS: 07/03/2021  Location: Office  MRN: [id number]  Setting: Ambulatory outpatient  Referring Provider: Gustavo Lah, MD  Type: Established Patient  Specialty: Interventional Pain Management  PCP: Gustavo Lah, MD  Location: Remote location  Delivery: TeleHealth     Virtual Encounter - Pain Management PROVIDER NOTE: Information contained herein reflects review and annotations entered in association with encounter. Interpretation of such information and data should be left to medically-trained personnel. Information provided to patient can be located elsewhere in the medical record under "Patient Instructions". Document created using STT-dictation technology, any transcriptional errors that may result from process are unintentional.    Contact & Pharmacy Preferred: [phone] Home: [phone] (home) Mobile: [phone] (mobile) E-mail: [email or web address]  [id number] [address], [place] [address] Phone: [phone] Fax: [phone]   Pre-screening  Eric Blankenship offered "in-person" vs "virtual" encounter. He indicated preferring virtual for this encounter.   Reason COVID-19*  Social distancing based on CDC and AMA recommendations.   I contacted Eric Blankenship on 07/03/2021 via telephone.      I clearly identified myself as Gillis Santa, MD. I verified that I was speaking with the correct person using two identifiers (Name: Eric Blankenship, and date of birth: 26-Dec-1975).  Consent I sought verbal advanced consent from Eric Blankenship for virtual visit interactions. I informed Eric Blankenship of possible security and privacy concerns, risks, and limitations associated with providing "not-in-person" medical evaluation and management services. I also informed Eric Blankenship of the availability of "in-person" appointments. Finally, I informed  him that there would be a charge for the virtual visit and that he could be  personally, fully or partially, financially responsible for it. Eric Blankenship expressed understanding and agreed to proceed.   Historic Elements   Eric Blankenship is a 45 y.o. year old, male patient evaluated today after our last contact on 06/05/2021. Eric Blankenship  has a past medical history of Anxiety and Congenital absence of one kidney. He also  has a past surgical history that includes No past surgeries. Eric Blankenship has a current medication list which includes the following prescription(s): albuterol, fenofibrate, gabapentin, hydrocodone-acetaminophen, fish oil, and [DISCONTINUED] clonazepam. He  reports that he has never smoked. He has quit using smokeless tobacco.  His smokeless tobacco use included snuff. He reports that he does not currently use alcohol. He reports that he does not currently use drugs. Eric Blankenship is allergic to penicillins and prednisone.   HPI  Today, he is being contacted for a post-procedure assessment.   Post-Procedure Evaluation  Procedure(s):    Procedure:          Type: Lumbar Facet, Medial Branch Block(s) #1  Primary Purpose: Diagnostic/Therapeutic Region: Posterolateral Lumbosacral Spine Level:L3, L4, L5,  Medial Branch Level(s). Injecting these levels blocks the L3-4, L4-5, lumbar facet joints. Laterality: Bilateral Anesthesia: Local (1-2% Lidocaine)  Anxiolysis: IV  Sedation: Minimal  Guidance: Fluoroscopy          Indications: 1. Lumbar spondylosis   2. Lumbar spondylolysis (B/L L5)   3. Chronic pain syndrome    Pain Score: Pre-procedure: 8 /10 Post-procedure: 7 /10      Anxiolysis: Please see nurses note.  Effectiveness during initial hour after procedure (Ultra-Short Term Relief): 100 %   Local anesthetic used: Long-acting (4-6 hours) Effectiveness: Defined as any analgesic benefit obtained secondary  to the administration of local anesthetics. This carries significant  diagnostic value as to the etiological location, or anatomical origin, of the pain. Duration of benefit is expected to coincide with the duration of the local anesthetic used.  Effectiveness during initial 4-6 hours after procedure (Short-Term Relief): 50 %   Long-term benefit: Defined as any relief past the pharmacologic duration of the local anesthetics.  Effectiveness past the initial 6 hours after procedure (Long-Term Relief): 0 % (No relief after procedure, no relief , states he hurts worse now.)   Benefits, current: Defined as benefit present at the time of this evaluation.   Analgesia:  0% Function: No benefit ROM: No benefit    Laboratory Chemistry Profile   Renal Lab Results  Component Value Date   BUN 18 08/01/2017   CREATININE 1.40 (H) 08/01/2017   BCR 13 08/01/2017   GFRAA 73 08/01/2017   GFRNONAA 63 08/01/2017    Hepatic Lab Results  Component Value Date   AST 22 08/01/2017   ALT 36 08/01/2017   ALBUMIN 4.8 08/01/2017   ALKPHOS 43 08/01/2017    Electrolytes Lab Results  Component Value Date   NA 142 08/01/2017   K 5.1 08/01/2017   CL 103 08/01/2017   CALCIUM 9.7 08/01/2017    Bone No results found for: VD25OH, VD125OH2TOT, FF6384YK5, LD3570VX7, 25OHVITD1, 25OHVITD2, 25OHVITD3, TESTOFREE, TESTOSTERONE  Inflammation (CRP: Acute Phase) (ESR: Chronic Phase) No results found for: CRP, ESRSEDRATE, LATICACIDVEN       Note: Above Lab results reviewed.  Assessment  The primary encounter diagnosis was Lumbar spondylosis. Diagnoses of Lumbar spondylolysis (B/L L5) and Chronic pain syndrome were also pertinent to this visit.  Plan of Care  Unfortunately, no benefit with diagnostic lumbar facet medial branch nerve blocks.  Do not recommend a repeat injection or moving forward with lumbar radiofrequency ablation.  Did offer the patient repeat lumbar epidural steroid injection as he has had better benefit with that compared to the recent medial branch nerve blocks that  he was referred to me for.  Patient states that he will think about this further and let us know if he would like to proceed.  Otherwise follow-up with PCP or orthopedics.  Patient would not be a candidate for chronic opioid therapy at this clinic.  Interventional management only.  Follow-up plan:   No follow-ups on file.    Recent Visits Date Type Provider Dept  06/05/21 Procedure visit Gillis Santa, MD [facility]  Showing recent visits within past 90 days and meeting all other requirements Today's Visits Date Type Provider Dept  07/03/21 Office Visit Gillis Santa, MD [facility]  Showing today's visits and meeting all other requirements Future Appointments No visits were found meeting these conditions. Showing future appointments within next 90 days and meeting all other requirements I discussed the assessment and treatment plan with the patient. The patient was provided an opportunity to ask questions and all were answered. The patient agreed with the plan and demonstrated an understanding of the instructions.  Patient advised to call back or seek an in-person evaluation if the symptoms or condition worsens.  Duration of encounter: 78minutes.  Note by: Gillis Santa, MD Date: 07/03/2021; Time: 2:27 PM

## 2022-02-26 ENCOUNTER — Telehealth: Payer: Self-pay

## 2022-02-26 DIAGNOSIS — M5416 Radiculopathy, lumbar region: Secondary | ICD-10-CM

## 2022-02-26 DIAGNOSIS — M5136 Other intervertebral disc degeneration, lumbar region: Secondary | ICD-10-CM

## 2022-02-26 NOTE — Telephone Encounter (Signed)
-----   Message from Rockey Situ sent at 02/26/2022 10:20 AM EDT ----- Regarding: new FCE order Contact: [phone] Patient had a FCE done at [facility] in [place] about 2 weeks ago. They have the results but will not release them because the original order is under [facility] Charity from [facility]. We need to send them an order from the new office and they will fax the results. Order needs to be faxed to [employer] fax [phone].

## 2022-02-27 NOTE — Telephone Encounter (Signed)
I have placed a new order and also faxed this to [facility] in [place].

## 2022-03-19 ENCOUNTER — Telehealth: Payer: Self-pay

## 2022-03-19 NOTE — Telephone Encounter (Signed)
-----   Message from Rockey Situ sent at 03/19/2022  3:10 PM EDT ----- Regarding: need an appt Beatriz Stallion from [employer] called that they are handling patient's case. They need for the patient to schedule an appt to review the FCE and have documentation of his permanent restrictions. You had said that for the patient to take the FCE to his employer and have them find a new position for him. Selena Batten said that they need documentation from the doctor's office or a rating. I told Selena Batten that we do not do "ratings".  When do you want to work him in or just next opening with [employer]? [phone]

## 2022-03-21 NOTE — Telephone Encounter (Signed)
Can you please notify the Law firm about Eric Blankenship's response?

## 2022-03-22 NOTE — Telephone Encounter (Signed)
Left message to call back  

## 2022-03-22 NOTE — Telephone Encounter (Signed)
I have typed up the note, can you please fax this. Thanks!

## 2022-03-22 NOTE — Telephone Encounter (Signed)
Per Selena Batten, the attorney would like for you to fax them over a note with your statement and recommendation. Fax is [phone]

## 2022-03-22 NOTE — Telephone Encounter (Signed)
Kim called back. She will discuss with the attorney and see what they would like to do next.

## 2022-03-23 NOTE — Telephone Encounter (Signed)
Note faxed to Kim.

## 2022-10-24 ENCOUNTER — Other Ambulatory Visit: Payer: Self-pay | Admitting: Physician Assistant

## 2022-10-24 DIAGNOSIS — N1330 Unspecified hydronephrosis: Secondary | ICD-10-CM

## 2022-10-30 ENCOUNTER — Ambulatory Visit
Admission: RE | Admit: 2022-10-30 | Discharge: 2022-10-30 | Disposition: A | Discharge status: Home / Self Care | Payer: 59 | Source: Ambulatory Visit | Attending: Physician Assistant | Admitting: Physician Assistant

## 2022-10-30 DIAGNOSIS — N1330 Unspecified hydronephrosis: Secondary | ICD-10-CM
